# Patient Record
Sex: Female | Born: 1990 | Race: White | Hispanic: No | Marital: Single | Smoking: Former smoker
Health system: Southern US, Community
[De-identification: ages and names within clinical notes are randomized; demographics above are authoritative.]

## PROBLEM LIST (undated history)

## (undated) ENCOUNTER — Inpatient Hospital Stay: Payer: Self-pay

## (undated) DIAGNOSIS — D649 Anemia, unspecified: Secondary | ICD-10-CM

## (undated) HISTORY — PX: TONSILLECTOMY: SUR1361

---

## 2014-04-10 NOTE — L&D Delivery Note (Signed)
Delivery Note At 11:21 PM a viable female was delivered via Vaginal, Spontaneous Delivery (Presentation: ; Occiput Anterior).  APGAR: 8, 9; weight 6 lb 1 oz (2750 g).   Placenta status: , Spontaneous.  Cord:  with the following complications: none apparent.  Cord pH: n/a  Anesthesia: None  Episiotomy:  none Lacerations:  none Suture Repair: none Est. Blood Loss (mL):  250cc  Mom to postpartum.  Baby to Couplet care / Skin to Skin.  ----- Ranae Plumber, MD Attending Obstetrician and Gynecologist [facility]

## 2014-09-18 LAB — OB RESULTS CONSOLE HIV ANTIBODY (ROUTINE TESTING): HIV: NONREACTIVE

## 2014-09-18 LAB — OB RESULTS CONSOLE RPR: RPR: NONREACTIVE

## 2014-09-18 LAB — OB RESULTS CONSOLE VARICELLA ZOSTER ANTIBODY, IGG: VARICELLA IGG: IMMUNE

## 2014-09-18 LAB — OB RESULTS CONSOLE RUBELLA ANTIBODY, IGM: RUBELLA: NON-IMMUNE/NOT IMMUNE

## 2014-11-02 ENCOUNTER — Observation Stay
Admission: EM | Admit: 2014-11-02 | Discharge: 2014-11-02 | Disposition: A | Discharge status: Home / Self Care | Payer: Medicaid - Out of State | Attending: Obstetrics and Gynecology | Admitting: Obstetrics and Gynecology

## 2014-11-02 DIAGNOSIS — R102 Pelvic and perineal pain: Secondary | ICD-10-CM | POA: Insufficient documentation

## 2014-11-02 DIAGNOSIS — Z3A33 33 weeks gestation of pregnancy: Secondary | ICD-10-CM | POA: Diagnosis not present

## 2014-11-02 DIAGNOSIS — O26899 Other specified pregnancy related conditions, unspecified trimester: Secondary | ICD-10-CM

## 2014-11-02 DIAGNOSIS — O26893 Other specified pregnancy related conditions, third trimester: Principal | ICD-10-CM | POA: Insufficient documentation

## 2014-11-02 DIAGNOSIS — R109 Unspecified abdominal pain: Secondary | ICD-10-CM

## 2014-11-02 HISTORY — DX: Anemia, unspecified: D64.9

## 2014-11-02 LAB — CHLAMYDIA/NGC RT PCR (ARMC ONLY)
Chlamydia Tr: NOT DETECTED
N gonorrhoeae: NOT DETECTED

## 2014-11-02 LAB — URINALYSIS COMPLETE WITH MICROSCOPIC (ARMC ONLY)
BILIRUBIN URINE: NEGATIVE
GLUCOSE, UA: NEGATIVE mg/dL
Hgb urine dipstick: NEGATIVE
Ketones, ur: NEGATIVE mg/dL
Nitrite: NEGATIVE
PROTEIN: NEGATIVE mg/dL
Specific Gravity, Urine: 1.019 (ref 1.005–1.030)
pH: 5 (ref 5.0–8.0)

## 2014-11-02 NOTE — Discharge Summary (Signed)
OB Triage Discharge Summary   24 y.o. [id number] @ [redacted]w[redacted]d presenting for vaginal pain.  Fetal status reassuring. EFM showed negative toco and rNST. SVE unchanged at 3cm/50/-2 over 90 minutes.  Labs pending: UCx, GC/CT and UDS RTC  7/29 Disposition: home  Triage evaluation done remotely: No.  Cornelia Copa MD [facility]  Pager: [phone]

## 2014-11-02 NOTE — Discharge Instructions (Signed)
Call provider or return to birthplace with: ° °1. Regular contractions °2. Leaking of fluid from your vaginal °3. Vaginal bleeding: Bright red or heavy like a period °4. Decreased Fetal movement ° °

## 2014-11-02 NOTE — OB Triage Note (Signed)
Pt arrives with complaint of vaginal pressure since 12:30 AM, this is constant with a pain scale of 7/10. She also reports DFM since last night. Denies bleeding, LOF.

## 2014-11-02 NOTE — H&P (Addendum)
Obstetrics Admission History & Physical  11/02/2014 - 6:49 PM Primary OBGYN: [facility]  Chief Complaint: vaginal pain  History of Present Illness  24 y.o. G3P1011 @ [redacted]w[redacted]d (Dating: EDC 9/8, dated by 27wk u/s), with the above CC. Pregnancy complicated by: late PNC, h/o chlamydia, h/o UDS + for xanax and normal 3hr GTT.  Ms. Marilyn Stanley states that she's having some vaginal pain so was advised to come in for evaluation. Some decreased FM but no s/s of labor and no abdominal pain. Minimal white/yellowish non smelly discharge and no itching  Review of Systems:  her 12 point review of systems is negative or as noted in the History of Present Illness.  PMHx:  Past Medical History  Diagnosis Date  . Anemia    PSHx: History reviewed. No pertinent past surgical history. Medications:  Prescriptions prior to admission  Medication Sig Dispense Refill Last Dose  . Prenatal Vit-Fe Fumarate-FA (PRENATAL MULTIVITAMIN) TABS tablet Take 1 tablet by mouth daily at 12 noon.   11/02/2014 at Unknown time     Allergies: has No Known Allergies. OBGYN Hx:  OB History  Gravida Para Term Preterm AB SAB TAB Ectopic Multiple Living  3 1 1  1     1     # Outcome Date GA Lbr Len/2nd Weight Sex Delivery Anes PTL Lv  3 Current           2 AB 05/24/13     TAB     1 Term 08/01/12    F Vag-Spont EPI  Y            FHx:  Family History  Problem Relation Age of Onset  . Cancer Maternal Grandfather   . Cancer Paternal Grandfather    Soc Hx:  History   Social History  . Marital Status: N/A    Spouse Name: N/A  . Number of Children: N/A  . Years of Education: N/A   Occupational History  . Not on file.   Social History Main Topics  . Smoking status: Former Smoker    Types: Cigarettes  . Smokeless tobacco: Never Used  . Alcohol Use: No  . Drug Use: No  . Sexual Activity: Not Currently    Birth Control/ Protection: Abstinence     Comment: last intercourse 12/15   Other Topics Concern  . Not on  file   Social History Narrative  . No narrative on file    Objective    Current Vital Signs 24h Vital Sign Ranges  T 98.6 F (37 C) Temp  Avg: 98.6 F (37 C)  Min: 98.6 F (37 C)  Max: 98.6 F (37 C)  BP 126/78 mmHg BP  Min: 126/78  Max: 126/78  HR 90 Pulse  Avg: 90  Min: 90  Max: 90  RR 18 Resp  Avg: 18  Min: 18  Max: 18  SaO2    98%/RA No Data Recorded       24 Hour I/O Current Shift I/O  Time Ins Outs       EFM: 130 baseline, +accels, no decels, mod variability  Toco: negative  General: Well nourished, well developed female in no acute distress.  Skin:  Warm and dry.  Respiratory:  Normal respiratory effort Abdomen: soft, gravid, nttp Neuro/Psych:  Normal mood and affect.   SSE: 3/70/-2 @ 1715, per RN.  Unchanged at 1900 by me @ 2-3/50/-2/sutures felt, BOW intact  Labs  UDS, GC/CT and UCx pending Results for IRELYND, ZUMSTEIN (MRN [id number]) as  of 11/02/2014 18:57  Ref. Range 11/02/2014 17:36  Appearance Latest Ref Range: CLEAR  CLEAR (A)  Bacteria, UA Latest Ref Range: NONE SEEN  RARE (A)  Bilirubin Urine Latest Ref Range: NEGATIVE  NEGATIVE  Color, Urine Latest Ref Range: YELLOW  YELLOW (A)  Glucose Latest Ref Range: NEGATIVE mg/dL NEGATIVE  Hgb urine dipstick Latest Ref Range: NEGATIVE  NEGATIVE  Ketones, ur Latest Ref Range: NEGATIVE mg/dL NEGATIVE  Leukocytes, UA Latest Ref Range: NEGATIVE  2+ (A)  Mucous Unknown PRESENT  Nitrite Latest Ref Range: NEGATIVE  NEGATIVE  pH Latest Ref Range: 5.0-8.0  5.0  Protein Latest Ref Range: NEGATIVE mg/dL NEGATIVE  RBC / HPF Latest Ref Range: 0-5 RBC/hpf 0-5  Specific Gravity, Urine Latest Ref Range: 1.005-1.030  1.019  Squamous Epithelial / LPF Latest Ref Range: NONE SEEN  0-5 (A)  WBC, UA Latest Ref Range: 0-5 WBC/hpf 6-30    Assessment & Plan   24 y.o. [id number] @ 33/4 with stable cervical exam *IUP: rNST, fetal status reassuring *vaginal pain: likely RL pain *EOL: negative toco (pt is skinny) and pt denies any s/s  of preterm labor, with stable cervical exam over 1.5hrs. PTL precautions given. Given what EFM looks like, likely that patient is probably in the 35-36wk range, given her poor dates *RTC: 7/29 as already scheduled -follow up UCx, GC/CT, UDS  Cornelia Copa MD [facility] Pager [phone]

## 2014-11-04 LAB — URINE CULTURE: Special Requests: NORMAL

## 2014-11-06 LAB — OB RESULTS CONSOLE GC/CHLAMYDIA
Chlamydia: NEGATIVE
Gonorrhea: NEGATIVE

## 2014-11-06 LAB — OB RESULTS CONSOLE GBS: STREP GROUP B AG: NEGATIVE

## 2014-11-12 ENCOUNTER — Observation Stay
Admission: EM | Admit: 2014-11-12 | Discharge: 2014-11-12 | Disposition: A | Discharge status: Home / Self Care | Payer: Medicaid - Out of State | Attending: Obstetrics and Gynecology | Admitting: Obstetrics and Gynecology

## 2014-11-12 DIAGNOSIS — M549 Dorsalgia, unspecified: Secondary | ICD-10-CM | POA: Insufficient documentation

## 2014-11-12 DIAGNOSIS — Z3A35 35 weeks gestation of pregnancy: Secondary | ICD-10-CM | POA: Diagnosis not present

## 2014-11-12 DIAGNOSIS — O26893 Other specified pregnancy related conditions, third trimester: Secondary | ICD-10-CM | POA: Diagnosis not present

## 2014-11-12 LAB — URINALYSIS COMPLETE WITH MICROSCOPIC (ARMC ONLY)
Bilirubin Urine: NEGATIVE
GLUCOSE, UA: NEGATIVE mg/dL
Hgb urine dipstick: NEGATIVE
NITRITE: NEGATIVE
Protein, ur: NEGATIVE mg/dL
Specific Gravity, Urine: 1.015 (ref 1.005–1.030)
pH: 5 (ref 5.0–8.0)

## 2014-11-12 NOTE — Discharge Instructions (Signed)
Drink plenty of fluids, get plenty of rest. cll your provider for any other concerns

## 2014-11-12 NOTE — Discharge Summary (Addendum)
Patient discharged home, discharge instructions given, patient states understanding. Patient left floor in stable condition, denies any other needs at this time. Patient to keep next scheduled OB appointment 

## 2014-11-12 NOTE — ED Notes (Signed)
Patient to L&D for "abdominal tightness". Patient sees Lauralee Evener, saw them last a week ago Friday. Patient reports having one live birth in past. States she is approx 34-36 weeks currently. No complications during this pregnancy. Denies fluid leaking or bleeding.

## 2014-11-12 NOTE — OB Triage Note (Signed)
Received to LD with complaints of lower back pain, hip pain and pelvic pressure.  Pt also states she is unable to eat or drink without throwing up. Last attempted to eat/drink was 12:00 and threw up

## 2014-11-12 NOTE — H&P (Signed)
Obstetric H&P   Chief Complaint: Mutiple  Prenatal Care Provider: [facility]  History of Present Illness: 24 y.o. G3P1011 [redacted]w[redacted]d by 12/17/2014 with late entry to care, positive UDS for xanax, positive chlamydia, failed 1-hr and normal follow up 3-hr test presenting from work with complaints of back pain, pelvic pressure, as well as nausea and emesis unable to keep down any food started this morning.  Has been able to keep down a full glass of water on presentation here without any episodes of emesis.    PNL: O pos / ABSC / RNI / VZI / HBsAg neg / RPR NR / HIV neg  TDAP adminsited 7/18  Review of Systems: 10 point review of systems negative unless otherwise noted in HPI  Past Medical History: Past Medical History  Diagnosis Date  . Anemia    Past Gynecologic History:  Family History: Family History  Problem Relation Age of Onset  . Cancer Maternal Grandfather   . Cancer Paternal Grandfather     Social History: History   Social History  . Marital Status: Single    Spouse Name: N/A  . Number of Children: N/A  . Years of Education: N/A   Occupational History  . Not on file.   Social History Main Topics  . Smoking status: Former Smoker    Types: Cigarettes  . Smokeless tobacco: Never Used  . Alcohol Use: No  . Drug Use: No  . Sexual Activity: Not Currently    Birth Control/ Protection: Abstinence     Comment: last intercourse 12/15   Other Topics Concern  . Not on file   Social History Narrative  . No narrative on file    Medications: Prior to Admission medications   Medication Sig Start Date End Date Taking? Authorizing Provider  Prenatal Vit-Fe Fumarate-FA (PRENATAL MULTIVITAMIN) TABS tablet Take 1 tablet by mouth daily at 12 noon.    Historical Provider, MD    Allergies: No Known Allergies  Physical Exam: Vitals: Blood pressure 122/70, pulse 91, last menstrual period 03/12/2014.  FHT: 130, moderate, postive accels, no decels Toco: none  General:  NAD HEENT: normocephalic, anciteric Pulmonary: no increased work of breathing Abdomen: Gravid,  Non-tender Leopolds: vtx Genitourinary: 2/70/-3 Extremities: no edema, no erythema  Labs: Results for orders placed or performed during the hospital encounter of 11/12/14 (from the past 24 hour(s))  Urinalysis complete, with microscopic ([facility] only)     Status: Abnormal   Collection Time: 11/12/14  3:03 PM  Result Value Ref Range   Color, Urine YELLOW (A) YELLOW   APPearance HAZY (A) CLEAR   Glucose, UA NEGATIVE NEGATIVE mg/dL   Bilirubin Urine NEGATIVE NEGATIVE   Ketones, ur TRACE (A) NEGATIVE mg/dL   Specific Gravity, Urine 1.015 1.005 - 1.030   Hgb urine dipstick NEGATIVE NEGATIVE   pH 5.0 5.0 - 8.0   Protein, ur NEGATIVE NEGATIVE mg/dL   Nitrite NEGATIVE NEGATIVE   Leukocytes, UA 1+ (A) NEGATIVE   RBC / HPF 0-5 0 - 5 RBC/hpf   WBC, UA 6-30 0 - 5 WBC/hpf   Bacteria, UA RARE (A) NONE SEEN   Squamous Epithelial / LPF 6-30 (A) NONE SEEN   Mucous PRESENT     Assessment: 24 y.o. G3P1011 [redacted]w[redacted]d by 12/17/2014, with discomforts of pregnancy  Plan: 1) Discomforts of pregnancy - no evidence of pyelonephritis, UTI, or labor.  Discussed supportive measures including heating pads, tylenol prn, pregnancy support belts  2) Fetus - cat I tracing  3) Nausea - rx zofran  ODT  4) Disposition - home routine labor precautions

## 2014-12-02 ENCOUNTER — Inpatient Hospital Stay
Admission: EM | Admit: 2014-12-02 | Discharge: 2014-12-04 | DRG: 774 | Disposition: A | Discharge status: Home / Self Care | Payer: Medicaid - Out of State | Attending: Obstetrics & Gynecology | Admitting: Obstetrics & Gynecology

## 2014-12-02 ENCOUNTER — Encounter: Payer: Self-pay | Admitting: Anesthesiology

## 2014-12-02 DIAGNOSIS — O9882 Other maternal infectious and parasitic diseases complicating childbirth: Principal | ICD-10-CM | POA: Diagnosis present

## 2014-12-02 DIAGNOSIS — Z3A37 37 weeks gestation of pregnancy: Secondary | ICD-10-CM | POA: Diagnosis present

## 2014-12-02 DIAGNOSIS — Z809 Family history of malignant neoplasm, unspecified: Secondary | ICD-10-CM | POA: Diagnosis not present

## 2014-12-02 DIAGNOSIS — Z87891 Personal history of nicotine dependence: Secondary | ICD-10-CM

## 2014-12-02 DIAGNOSIS — O9902 Anemia complicating childbirth: Secondary | ICD-10-CM | POA: Diagnosis present

## 2014-12-02 LAB — CBC
HCT: 25.9 % — ABNORMAL LOW (ref 35.0–47.0)
Hemoglobin: 8 g/dL — ABNORMAL LOW (ref 12.0–16.0)
MCH: 24.7 pg — ABNORMAL LOW (ref 26.0–34.0)
MCHC: 31 g/dL — AB (ref 32.0–36.0)
MCV: 79.7 fL — ABNORMAL LOW (ref 80.0–100.0)
PLATELETS: 212 10*3/uL (ref 150–440)
RBC: 3.25 MIL/uL — ABNORMAL LOW (ref 3.80–5.20)
RDW: 16.2 % — AB (ref 11.5–14.5)
WBC: 16.9 10*3/uL — AB (ref 3.6–11.0)

## 2014-12-02 LAB — TYPE AND SCREEN
ABO/RH(D): O POS
ANTIBODY SCREEN: NEGATIVE

## 2014-12-02 LAB — COMPREHENSIVE METABOLIC PANEL
ALT: 9 U/L — ABNORMAL LOW (ref 14–54)
AST: 22 U/L (ref 15–41)
Albumin: 2.6 g/dL — ABNORMAL LOW (ref 3.5–5.0)
Alkaline Phosphatase: 135 U/L — ABNORMAL HIGH (ref 38–126)
Anion gap: 7 (ref 5–15)
BILIRUBIN TOTAL: 0.5 mg/dL (ref 0.3–1.2)
BUN: 6 mg/dL (ref 6–20)
CO2: 23 mmol/L (ref 22–32)
Calcium: 8.4 mg/dL — ABNORMAL LOW (ref 8.9–10.3)
Chloride: 106 mmol/L (ref 101–111)
Creatinine, Ser: 0.52 mg/dL (ref 0.44–1.00)
Glucose, Bld: 81 mg/dL (ref 65–99)
POTASSIUM: 3.7 mmol/L (ref 3.5–5.1)
Sodium: 136 mmol/L (ref 135–145)
TOTAL PROTEIN: 5.9 g/dL — AB (ref 6.5–8.1)

## 2014-12-02 LAB — URINE DRUG SCREEN, QUALITATIVE (ARMC ONLY)
Amphetamines, Ur Screen: NOT DETECTED
BARBITURATES, UR SCREEN: NOT DETECTED
Benzodiazepine, Ur Scrn: NOT DETECTED
CANNABINOID 50 NG, UR ~~LOC~~: NOT DETECTED
COCAINE METABOLITE, UR ~~LOC~~: NOT DETECTED
MDMA (Ecstasy)Ur Screen: NOT DETECTED
Methadone Scn, Ur: NOT DETECTED
Opiate, Ur Screen: POSITIVE — AB
Phencyclidine (PCP) Ur S: NOT DETECTED
TRICYCLIC, UR SCREEN: NOT DETECTED

## 2014-12-02 LAB — CHLAMYDIA/NGC RT PCR (ARMC ONLY)
CHLAMYDIA TR: NOT DETECTED
N gonorrhoeae: NOT DETECTED

## 2014-12-02 MED ORDER — ACETAMINOPHEN 325 MG PO TABS
650.0000 mg | ORAL_TABLET | ORAL | Status: DC | PRN
  Filled 2014-12-02: qty 2

## 2014-12-02 MED ORDER — LIDOCAINE HCL (PF) 1 % IJ SOLN
30.0000 mL | INTRAMUSCULAR | Status: DC | PRN
  Filled 2014-12-02: qty 30

## 2014-12-02 MED ORDER — BUTORPHANOL TARTRATE 1 MG/ML IJ SOLN
1.0000 mg | INTRAMUSCULAR | Status: DC | PRN
  Administered 2014-12-02: 1 mg via INTRAVENOUS
  Filled 2014-12-02: qty 1

## 2014-12-02 MED ORDER — FENTANYL 2.5 MCG/ML W/ROPIVACAINE 0.2% IN NS 100 ML EPIDURAL INFUSION (ARMC-ANES)
EPIDURAL | Status: AC
  Filled 2014-12-02: qty 100

## 2014-12-02 MED ORDER — LACTATED RINGERS IV SOLN
INTRAVENOUS | Status: DC
  Administered 2014-12-02: 22:00:00 via INTRAVENOUS

## 2014-12-02 MED ORDER — CITRIC ACID-SODIUM CITRATE 334-500 MG/5ML PO SOLN: 30.0000 mL | ORAL | Status: DC | PRN

## 2014-12-02 MED ORDER — ONDANSETRON HCL 4 MG/2ML IJ SOLN: 4.0000 mg | Freq: Four times a day (QID) | INTRAMUSCULAR | Status: DC | PRN

## 2014-12-02 MED ORDER — OXYTOCIN 40 UNITS IN LACTATED RINGERS INFUSION - SIMPLE MED
62.5000 mL/h | INTRAVENOUS | Status: DC
  Filled 2014-12-02: qty 1000

## 2014-12-02 MED ORDER — OXYTOCIN BOLUS FROM INFUSION
500.0000 mL | INTRAVENOUS | Status: DC
Start: 2014-12-02 — End: 2014-12-03
  Administered 2014-12-02: 500 mL via INTRAVENOUS

## 2014-12-02 NOTE — H&P (Signed)
Obstetric H&P   Chief Complaint: Mutiple  Prenatal Care Provider: [facility]  History of Present Illness: 24 y.o. G3P1011 37.6 with EDC of 12/17/2014. With late entry to care, positive UDS for xanax, positive chlamydia, failed 1-hr and normal follow up 3-hr test presents to L&D triage with concern for ROM.  She states she had a orange/yellow discharge yesterday, and has some persistent discharge since.  Nothing that fills a pad or soils her clothes.  +FM, no CTX, no VB.  PNL: O pos / ABSC / RNI / VZI / HBsAg neg / RPR NR / HIV neg  TDAP adminsited 7/18  Review of Systems: 10 point review of systems negative unless otherwise noted in HPI  Past Medical History: Past Medical History  Diagnosis Date  . Anemia    Past Gynecologic History:  Family History: Family History  Problem Relation Age of Onset  . Cancer Maternal Grandfather   . Cancer Paternal Grandfather     Social History: Social History   Social History  . Marital Status: Single    Spouse Name: N/A  . Number of Children: N/A  . Years of Education: N/A   Occupational History  . Not on file.   Social History Main Topics  . Smoking status: Former Smoker    Types: Cigarettes  . Smokeless tobacco: Never Used  . Alcohol Use: No  . Drug Use: No  . Sexual Activity: Not Currently    Birth Control/ Protection: Abstinence     Comment: last intercourse 12/15   Other Topics Concern  . Not on file   Social History Narrative  . No narrative on file    Medications: Prior to Admission medications   Medication Sig Start Date End Date Taking? Authorizing Provider  Prenatal Vit-Fe Fumarate-FA (PRENATAL MULTIVITAMIN) TABS tablet Take 1 tablet by mouth daily at 12 noon.    Historical Provider, MD    Allergies: No Known Allergies  Physical Exam: Vitals BP 136/83 mmHg  Pulse 100  Temp(Src) 98.1 F (36.7 C) (Oral)  Resp 19     FHT: 140, moderate, postive accels, no decels Toco: q33mn SVE: 5/80/-1, membranes  intact SSE: nitrazine negative  General: NAD HEENT: normocephalic, anciteric Pulmonary: no increased work of breathing Abdomen: Gravid,  Non-tender Leopolds: vtx Extremities: no edema, no erythema  Labs: Results for orders placed or performed during the hospital encounter of 12/02/14 (from the past 24 hour(s))  CBC     Status: Abnormal   Collection Time: 12/02/14  4:30 PM  Result Value Ref Range   WBC 16.9 (H) 3.6 - 11.0 K/uL   RBC 3.25 (L) 3.80 - 5.20 MIL/uL   Hemoglobin 8.0 (L) 12.0 - 16.0 g/dL   HCT 25.9 (L) 35.0 - 47.0 %   MCV 79.7 (L) 80.0 - 100.0 fL   MCH 24.7 (L) 26.0 - 34.0 pg   MCHC 31.0 (L) 32.0 - 36.0 g/dL   RDW 16.2 (H) 11.5 - 14.5 %   Platelets 212 150 - 440 K/uL  Type and screen     Status: None (Preliminary result)   Collection Time: 12/02/14  4:30 PM  Result Value Ref Range   ABO/RH(D) PENDING    Antibody Screen PENDING    Sample Expiration 12/05/2014     Assessment: 24y.o. G3P1011 320w0dy 12/17/2014, with discomforts of pregnancy  Plan: 1) Recheck in a few hours for evidence of labor.     ++++++++++++++++++  Rechecked:  Cervix now 6cm, 80%, still -1  1. Admit for  labor 2. Continuous toco/eft 3. Patient has eaten/showered/walked around 4. Will AROM when settled. 5. IVF LR @ 125, CBC, T&S, GC/CT, UDS 7. Clear liquid diet for now. 8. Anticipate vaginal delivery 9. MMR post partum  ----- Larey Days, MD Attending Obstetrician and Gynecologist [facility]

## 2014-12-03 LAB — RPR: RPR Ser Ql: NONREACTIVE

## 2014-12-03 LAB — CBC
HCT: 23.5 % — ABNORMAL LOW (ref 35.0–47.0)
HEMOGLOBIN: 7.4 g/dL — AB (ref 12.0–16.0)
MCH: 24.5 pg — AB (ref 26.0–34.0)
MCHC: 31.3 g/dL — AB (ref 32.0–36.0)
MCV: 78.3 fL — AB (ref 80.0–100.0)
Platelets: 205 10*3/uL (ref 150–440)
RBC: 3 MIL/uL — ABNORMAL LOW (ref 3.80–5.20)
RDW: 16.2 % — ABNORMAL HIGH (ref 11.5–14.5)
WBC: 22.1 10*3/uL — ABNORMAL HIGH (ref 3.6–11.0)

## 2014-12-03 LAB — ABO/RH: ABO/RH(D): O POS

## 2014-12-03 MED ORDER — BENZOCAINE-MENTHOL 20-0.5 % EX AERO
1.0000 "application " | INHALATION_SPRAY | CUTANEOUS | Status: DC | PRN
  Administered 2014-12-03: 1 via TOPICAL
  Filled 2014-12-03: qty 56

## 2014-12-03 MED ORDER — DIBUCAINE 1 % RE OINT: 1.0000 "application " | TOPICAL_OINTMENT | RECTAL | Status: DC | PRN

## 2014-12-03 MED ORDER — TETANUS-DIPHTH-ACELL PERTUSSIS 5-2.5-18.5 LF-MCG/0.5 IM SUSP: 0.5000 mL | Freq: Once | INTRAMUSCULAR | Status: DC

## 2014-12-03 MED ORDER — IBUPROFEN 600 MG PO TABS
600.0000 mg | ORAL_TABLET | Freq: Four times a day (QID) | ORAL | Status: DC
  Administered 2014-12-03 – 2014-12-04 (×6): 600 mg via ORAL
  Filled 2014-12-03 (×5): qty 1

## 2014-12-03 MED ORDER — MEASLES, MUMPS & RUBELLA VAC ~~LOC~~ INJ
0.5000 mL | INJECTION | Freq: Once | SUBCUTANEOUS | Status: AC
  Administered 2014-12-04: 0.5 mL via SUBCUTANEOUS
  Filled 2014-12-03 (×2): qty 0.5

## 2014-12-03 MED ORDER — DOCUSATE SODIUM 100 MG PO CAPS
100.0000 mg | ORAL_CAPSULE | Freq: Two times a day (BID) | ORAL | Status: DC
  Administered 2014-12-03 – 2014-12-04 (×2): 100 mg via ORAL
  Filled 2014-12-03 (×2): qty 1

## 2014-12-03 MED ORDER — ONDANSETRON HCL 4 MG PO TABS: 4.0000 mg | ORAL_TABLET | ORAL | Status: DC | PRN

## 2014-12-03 MED ORDER — DIPHENHYDRAMINE HCL 25 MG PO CAPS: 25.0000 mg | ORAL_CAPSULE | Freq: Four times a day (QID) | ORAL | Status: DC | PRN

## 2014-12-03 MED ORDER — ONDANSETRON HCL 4 MG/2ML IJ SOLN: 4.0000 mg | INTRAMUSCULAR | Status: DC | PRN

## 2014-12-03 MED ORDER — IBUPROFEN 800 MG PO TABS
ORAL_TABLET | ORAL | Status: AC
  Administered 2014-12-03: 600 mg via ORAL
  Filled 2014-12-03: qty 1

## 2014-12-03 MED ORDER — SIMETHICONE 80 MG PO CHEW: 80.0000 mg | CHEWABLE_TABLET | ORAL | Status: DC | PRN

## 2014-12-03 MED ORDER — WITCH HAZEL-GLYCERIN EX PADS: 1.0000 "application " | MEDICATED_PAD | CUTANEOUS | Status: DC | PRN

## 2014-12-03 MED ORDER — FE FUMARATE-B12-VIT C-FA-IFC PO CAPS
1.0000 | ORAL_CAPSULE | Freq: Two times a day (BID) | ORAL | Status: DC
  Administered 2014-12-04: 1 via ORAL
  Filled 2014-12-03 (×6): qty 1

## 2014-12-03 MED ORDER — LANOLIN HYDROUS EX OINT: TOPICAL_OINTMENT | CUTANEOUS | Status: DC | PRN

## 2014-12-03 MED ORDER — ACETAMINOPHEN 325 MG PO TABS
650.0000 mg | ORAL_TABLET | ORAL | Status: DC | PRN
  Administered 2014-12-03: 650 mg via ORAL

## 2014-12-03 MED ORDER — PRENATAL MULTIVITAMIN CH
1.0000 | ORAL_TABLET | Freq: Every day | ORAL | Status: DC
  Administered 2014-12-03 – 2014-12-04 (×2): 1 via ORAL
  Filled 2014-12-03 (×2): qty 1

## 2014-12-03 NOTE — Discharge Summary (Signed)
Obstetrical Discharge Summary  Date of Admission: 12/02/2014 Date of Discharge: 12/04/2014  Primary OB: [facility]  Gestational Age at Delivery: [redacted]w[redacted]d   Antepartum complications: none apparent Reason for Admission:  labor Date of Delivery:  12/02/2014 Delivered By: Leeroy Bock Ward Delivery Type: spontaneous vaginal delivery Intrapartum complications/course: routine Anesthesia: epidural Placenta: sponatneous Laceration: none Episiotomy: none Newborn Data: Live born female  Birth Weight: 6 lb 1 oz (2750 g) APGAR: 8, 9  Discharge Physical Exam:  General: NAD CV: RRR Pulm: CTABL, nl effort ABD: s/nd/nt, fundus firm and below the umbilicus Lochia: moderate DVT Evaluation: LE non-ttp, no evidence of DVT on exam.  HEMOGLOBIN  Date Value Ref Range Status  12/03/2014 7.4* 12.0 - 16.0 g/dL Final   HCT  Date Value Ref Range Status  12/03/2014 23.5* 35.0 - 47.0 % Final    Post partum course: routine Postpartum Procedures: none Disposition: stable, discharge to home.  Rh Immune globulin given: no Rubella vaccine given: yes Tdap vaccine given in AP or PP setting: postpartum Flu vaccine given in AP or PP setting: n/a  Contraception: plans laparoscopic tubal (declines while in hospital)  Prenatal Labs:  O pos / ABSC / RNI / VZI / HBsAg neg / RPR NR  / HIV neg/ gbs neg   Plan:  Marilyn Stanley was discharged to home in good condition. Follow-up appointment at [facility] with Dr. Elesa Massed in 4 weeks.   Discharge Medications:   Medication List    TAKE these medications        ibuprofen 600 MG tablet  Commonly known as:  ADVIL,MOTRIN  Take 1 tablet (600 mg total) by mouth every 6 (six) hours.     prenatal multivitamin Tabs tablet  Take 1 tablet by mouth daily at 12 noon.        Signed: Annamarie Major, MD [phone]

## 2014-12-03 NOTE — Progress Notes (Signed)
apprrPost Partum Day 1 (12 hours pp) Subjective: Feels weak when OOB. Voiding without difficulty. Baby bottlefeeding well. Denies lightheadedness or palpitations  Objective: Blood pressure 126/71, pulse 78, temperature 98 F (36.7 C), temperature source Oral, resp. rate 20, height 5' 4"  (1.626 m), weight 145 lb (65.772 kg), last menstrual period 03/12/2014, SpO2 100 %, unknown if currently breastfeeding.  Physical Exam:  General: alert, cooperative and pale Lochia: appropriate Uterine Fundus: firm at U-2/ ML/ NT DVT Evaluation: No evidence of DVT seen on physical exam.   Recent Labs  12/02/14 1630 12/03/14 0546  HGB 8.0* 7.4*  HCT 25.9* 23.5*  WBC 16.9* 22.1*  PLT 212 205    Assessment/Plan: PPD #1 Chronic anemia with appropriate drop after delivery  Hemodynamically stable-po iron and vitamin supplementation  Safety issues discussed Contraception: wants BTL. May need to do prior to 6 weeks visit. Discussed high rate of regret at her age  61 to schedule appt with Dr Leonides Schanz to discuss [id number] tubal once recovered from delivery and anemia improved vs using a LARC. Bottle Needs MMR prior to discharge Probable discharge in AM.    LOS: 1 day   Marilyn Stanley 12/03/2014, 12:46 PM

## 2014-12-03 NOTE — Plan of Care (Signed)
Patient and newborn UDS were both positive for opiates.  Patient admitted to taking hydrocodone during the last month of her pregnancy.  She said she was taking the medication due to her being dilated approx. 4 cm for the last month leading up to delivery, but she still needed to continue working.  The pain medication was taken to tolerate work shifts.  Per patient, her OB was aware of her taking the medication and that she already had it (so it was not a new prescription).  Reviewed the NAS scoring process with the patient and answered her questions.  Also explained to patient that she would be visited by the pediatrician and social worker per [facility] policy.  Patient was not defensive; she offered information willingly; she expressed the desire to make sure the baby was ok.

## 2014-12-04 MED ORDER — IBUPROFEN 600 MG PO TABS: 600.0000 mg | ORAL_TABLET | Freq: Four times a day (QID) | ORAL | Status: DC

## 2014-12-04 NOTE — Discharge Instructions (Signed)
Vaginal Delivery, Care After °Refer to this sheet in the next few weeks. These discharge instructions provide you with information on caring for yourself after delivery. Your caregiver may also give you specific instructions. Your treatment has been planned according to the most current medical practices available, but problems sometimes occur. Call your caregiver if you have any problems or questions after you go home. °HOME CARE INSTRUCTIONS °· Take over-the-counter or prescription medicines only as directed by your caregiver or pharmacist. °· Do not drink alcohol, especially if you are breastfeeding or taking medicine to relieve pain. °· Do not chew or smoke tobacco. °· Do not use illegal drugs. °· Continue to use good perineal care. Good perineal care includes: °¨ Wiping your perineum from front to back. °¨ Keeping your perineum clean. °· Do not use tampons or douche until your caregiver says it is okay. °· Shower, wash your hair, and take tub baths as directed by your caregiver. °· Wear a well-fitting bra that provides breast support. °· Eat healthy foods. °· Drink enough fluids to keep your urine clear or pale yellow. °· Eat high-fiber foods such as whole grain cereals and breads, brown rice, beans, and fresh fruits and vegetables every day. These foods may help prevent or relieve constipation. °· Follow your caregiver's recommendations regarding resumption of activities such as climbing stairs, driving, lifting, exercising, or traveling. °· Talk to your caregiver about resuming sexual activities. Resumption of sexual activities is dependent upon your risk of infection, your rate of healing, and your comfort and desire to resume sexual activity. °· Try to have someone help you with your household activities and your newborn for at least a few days after you leave the hospital. °· Rest as much as possible. Try to rest or take a nap when your newborn is sleeping. °· Increase your activities gradually. °· Keep  all of your scheduled postpartum appointments. It is very important to keep your scheduled follow-up appointments. At these appointments, your caregiver will be checking to make sure that you are healing physically and emotionally. °SEEK MEDICAL CARE IF:  °· You are passing large clots from your vagina. Save any clots to show your caregiver. °· You have a foul smelling discharge from your vagina. °· You have trouble urinating. °· You are urinating frequently. °· You have pain when you urinate. °· You have a change in your bowel movements. °· You have increasing redness, pain, or swelling near your vaginal incision (episiotomy) or vaginal tear. °· You have pus draining from your episiotomy or vaginal tear. °· Your episiotomy or vaginal tear is separating. °· You have painful, hard, or reddened breasts. °· You have a severe headache. °· You have blurred vision or see spots. °· You feel sad or depressed. °· You have thoughts of hurting yourself or your newborn. °· You have questions about your care, the care of your newborn, or medicines. °· You are dizzy or light-headed. °· You have a rash. °· You have nausea or vomiting. °· You were breastfeeding and have not had a menstrual period within 12 weeks after you stopped breastfeeding. °· You are not breastfeeding and have not had a menstrual period by the 12th week after delivery. °· You have a fever. °SEEK IMMEDIATE MEDICAL CARE IF:  °· You have persistent pain. °· You have chest pain. °· You have shortness of breath. °· You faint. °· You have leg pain. °· You have stomach pain. °· Your vaginal bleeding saturates two or more sanitary pads   in 1 hour. MAKE SURE YOU:   Understand these instructions.  Will watch your condition.  Will get help right away if you are not doing well or get worse. Document Released: 03/24/2000 Document Revised: 08/11/2013 Document Reviewed: 11/22/2011 [facility] Patient Information 2015 [place], [place]. This information is not intended to  replace advice given to you by your health care provider. Make sure you discuss any questions you have with your health care provider.  Please call your doctor or return to the ER if you experience any chest pains, shortness of breath, fever greater than 101, any heavy bleeding or large clots, and foul smelling vaginal discharge, any worsening abdominal pain & cramping that is not controlled by pain medication, or any signs of post partum depression.  No tampons, enemas, douches, or sexual intercourse for 6 weeks.  Also avoid tub baths, hot tubs, or swimming for 6 weeks.

## 2014-12-04 NOTE — Progress Notes (Signed)
Patient given discharge instructions at bedside and verbalized understanding of care of self and newborn when return to home. Patient informed of date and time of follow up appointment with MD and expresses no need for further teaching. Patient discharged to home, but newborn will remain inpatient as pediatric patient for further evaluation. Patient will remain at bedside with baby.

## 2014-12-04 NOTE — Progress Notes (Signed)
Post Partum Day 2  Subjective: Diet and Ambulating well.  Voiding without difficulty. Baby bottlefeeding well. Denies lightheadedness or palpitations  Objective: Blood pressure 118/66, pulse 90, temperature 98 F (36.7 C), temperature source Oral, resp. rate 18, height _0  (1.626 m), weight 65.772 kg (145 lb), last menstrual period 03/12/2014, SpO2 100 %, unknown if currently breastfeeding.  Physical Exam:  General: alert, cooperative and pale Lochia: appropriate Uterine Fundus: firm at U-2/ ML/ NT DVT Evaluation: No evidence of DVT seen on physical exam.   Recent Labs  12/02/14 1630 12/03/14 0546  HGB 8.0* 7.4*  HCT 25.9* 23.5*  WBC 16.9* 22.1*  PLT 212 205    Assessment/Plan: PPD #2 Chronic anemia with appropriate drop after delivery  Hemodynamically stable-po iron and vitamin supplementation  Safety issues discussed Contraception: wants BTL. May need to do prior to 6 weeks visit. Discussed high rate of regret at her age  24 to schedule appt with Dr Leonides Schanz to discuss IWO:[id number] tubal once recovered from delivery and anemia improved vs using a LARC. Bottle Feeding Needs MMR prior to discharge TDaP up to date   LOS: 2 days   [place] 12/04/2014, 8:26 AM

## 2014-12-04 NOTE — Progress Notes (Signed)
Clinical Education officer, museum (CSW) received consult for positive opiates in urine screen for Marilyn Stanley and mom. CSW discussed case with RN before assessing Marilyn Stanley. Per RN Mom has been appropriate throughout hospitalization and consult was put in by MD because it was mandatory. CSW met with Marilyn Stanley Marilyn Stanley) at bedside. CSW introduced self and explained role of CSW department. Mom answered all questions and was appropriate during assessment. Marilyn Stanley reported that she lives in [place] with her Marilyn Stanley Marilyn Stanley and has a 84 year old daughter Marilyn Stanley. Per Marilyn Stanley this is her second child. Marilyn Stanley works at the [employer] in [place] and has to [employer] and be on concrete floors all day. Marilyn Stanley reported that she was 4 CM dilated for a week and took 1/2 of hydrocodone pill to get through work. Marilyn Stanley reported that she got the pill from her friend at work and does not have a prescription. Marilyn Stanley reported that she told her MD she was taking the hydrocodone. Marilyn Stanley denied alcohol and drug use. CSW explained that a Child Protective Services report will have to be made based on Marilyn Stanley having a positive urine screen. Mom verbalized her understanding and reacted appropriately. CSW explained to mom that CPS will likely call and come out to the house to assess safety of the children. Marilyn Stanley and Marilyn Stanley are being discharged from hospital today.   CPS report was made in [facility].   Marilyn Stanley, [name] [phone]

## 2015-04-14 ENCOUNTER — Other Ambulatory Visit: Payer: Self-pay | Admitting: Family Medicine

## 2015-04-14 ENCOUNTER — Ambulatory Visit
Admission: RE | Admit: 2015-04-14 | Discharge: 2015-04-14 | Disposition: A | Discharge status: Home / Self Care | Payer: BLUE CROSS/BLUE SHIELD | Source: Ambulatory Visit | Attending: Family Medicine | Admitting: Family Medicine

## 2015-04-14 DIAGNOSIS — Z975 Presence of (intrauterine) contraceptive device: Secondary | ICD-10-CM | POA: Insufficient documentation

## 2015-04-14 DIAGNOSIS — R0609 Other forms of dyspnea: Principal | ICD-10-CM

## 2015-04-14 DIAGNOSIS — Z862 Personal history of diseases of the blood and blood-forming organs and certain disorders involving the immune mechanism: Secondary | ICD-10-CM | POA: Diagnosis present

## 2015-04-14 MED ORDER — IOHEXOL 350 MG/ML SOLN
75.0000 mL | Freq: Once | INTRAVENOUS | Status: AC | PRN
  Administered 2015-04-14: 75 mg via INTRAVENOUS

## 2015-04-19 ENCOUNTER — Emergency Department
Admission: EM | Admit: 2015-04-19 | Discharge: 2015-04-19 | Disposition: A | Discharge status: Home / Self Care | Payer: BLUE CROSS/BLUE SHIELD | Attending: Emergency Medicine | Admitting: Emergency Medicine

## 2015-04-19 ENCOUNTER — Encounter: Payer: Self-pay | Admitting: Emergency Medicine

## 2015-04-19 DIAGNOSIS — Z87891 Personal history of nicotine dependence: Secondary | ICD-10-CM | POA: Diagnosis not present

## 2015-04-19 DIAGNOSIS — R42 Dizziness and giddiness: Secondary | ICD-10-CM

## 2015-04-19 DIAGNOSIS — Z3202 Encounter for pregnancy test, result negative: Secondary | ICD-10-CM | POA: Diagnosis not present

## 2015-04-19 DIAGNOSIS — Z79899 Other long term (current) drug therapy: Secondary | ICD-10-CM | POA: Insufficient documentation

## 2015-04-19 DIAGNOSIS — R634 Abnormal weight loss: Secondary | ICD-10-CM | POA: Diagnosis not present

## 2015-04-19 DIAGNOSIS — R0602 Shortness of breath: Secondary | ICD-10-CM | POA: Diagnosis not present

## 2015-04-19 LAB — URINALYSIS COMPLETE WITH MICROSCOPIC (ARMC ONLY)
BILIRUBIN URINE: NEGATIVE
Bacteria, UA: NONE SEEN
Glucose, UA: NEGATIVE mg/dL
HGB URINE DIPSTICK: NEGATIVE
Nitrite: NEGATIVE
PH: 6 (ref 5.0–8.0)
Protein, ur: NEGATIVE mg/dL
Specific Gravity, Urine: 1.027 (ref 1.005–1.030)

## 2015-04-19 LAB — COMPREHENSIVE METABOLIC PANEL
ALBUMIN: 4.5 g/dL (ref 3.5–5.0)
ALT: 11 U/L — ABNORMAL LOW (ref 14–54)
ANION GAP: 5 (ref 5–15)
AST: 15 U/L (ref 15–41)
Alkaline Phosphatase: 92 U/L (ref 38–126)
BILIRUBIN TOTAL: 0.5 mg/dL (ref 0.3–1.2)
BUN: 14 mg/dL (ref 6–20)
CO2: 27 mmol/L (ref 22–32)
Calcium: 9.3 mg/dL (ref 8.9–10.3)
Chloride: 108 mmol/L (ref 101–111)
Creatinine, Ser: 0.87 mg/dL (ref 0.44–1.00)
Glucose, Bld: 93 mg/dL (ref 65–99)
POTASSIUM: 4.2 mmol/L (ref 3.5–5.1)
Sodium: 140 mmol/L (ref 135–145)
TOTAL PROTEIN: 7.9 g/dL (ref 6.5–8.1)

## 2015-04-19 LAB — POCT PREGNANCY, URINE: Preg Test, Ur: NEGATIVE

## 2015-04-19 LAB — CBC
HEMATOCRIT: 36.3 % (ref 35.0–47.0)
Hemoglobin: 11.3 g/dL — ABNORMAL LOW (ref 12.0–16.0)
MCH: 23.2 pg — ABNORMAL LOW (ref 26.0–34.0)
MCHC: 31.2 g/dL — AB (ref 32.0–36.0)
MCV: 74.5 fL — AB (ref 80.0–100.0)
Platelets: 318 10*3/uL (ref 150–440)
RBC: 4.88 MIL/uL (ref 3.80–5.20)
RDW: 19.7 % — AB (ref 11.5–14.5)
WBC: 13.1 10*3/uL — ABNORMAL HIGH (ref 3.6–11.0)

## 2015-04-19 LAB — DIFFERENTIAL
BASOS ABS: 0.1 10*3/uL (ref 0–0.1)
BASOS PCT: 1 %
EOS ABS: 0.4 10*3/uL (ref 0–0.7)
Eosinophils Relative: 3 %
LYMPHS ABS: 3.2 10*3/uL (ref 1.0–3.6)
LYMPHS PCT: 25 %
Monocytes Absolute: 0.8 10*3/uL (ref 0.2–0.9)
Monocytes Relative: 6 %
NEUTROS PCT: 65 %
Neutro Abs: 8.6 10*3/uL — ABNORMAL HIGH (ref 1.4–6.5)

## 2015-04-19 LAB — ABO/RH: ABO/RH(D): O POS

## 2015-04-19 MED ORDER — SODIUM CHLORIDE 0.9 % IV BOLUS (SEPSIS)
1000.0000 mL | Freq: Once | INTRAVENOUS | Status: AC
  Administered 2015-04-19: 1000 mL via INTRAVENOUS

## 2015-04-19 NOTE — ED Notes (Signed)
Past couple of weeks, pt has been sob with exertion to the point she has to sit down, denies asthma or cough, states she has had some rectal bleeding with clots in the toilet, denies hemorrhoids. Mom states pt was diagnosed with anemia, dr appoint on Jan 18 for further testing. Pt appears pale.

## 2015-04-19 NOTE — Discharge Instructions (Signed)

## 2015-04-19 NOTE — ED Provider Notes (Signed)
Review of Care Everywhere: "She was diagnosed with dehydration at the emergency room and had instructions to hydrate well. She was seen here the next day after the visit of the emergency room, it was found that she had anemia and because of her previous history of hypothyroidism her thyroid was checked showing that she had some nodules the patient reported unexpected weight loss of about 15 pounds in 4 months. An ultrasound of her neck and thyroid was performed showing nodular thyroid. As time has progressed her shortness of breath has increased." Last hgb was 11.7 04/14/15  [facility] Emergency Department Provider Note  ____________________________________________  Time seen: Approximately 3:56 PM  I have reviewed the triage vital signs and the nursing notes.   HISTORY  Chief Complaint Shortness of Breath; Anemia; and Fatigue    HPI Marilyn Stanley is a 25 y.o. female has been experiencing shortness of breath, weight loss and feeling lightheaded especially while walking at work for about the last month. She reports that she is seen both her [facility], and recently had a CT scan to rule out a blood clot less than a week ago for the same. She reports she felt much better after receiving fluids at [facility], and this went away for a couple days but then seemed to come back again.  Patient reports that she does not feel short of breath at the present time but whenever she gets up and walks she does start to feel short of breath after walking. She notes this is off and on throughout the day and also notably worse at work with standing. No chest pain. No fevers chills or productive cough. No wheezing. No nausea or vomiting. Denies pregnancy today. She does have a history of anemia and wonders if her blood count may be low. She does report that she is occasionally seen a spot or 2 of blood on the toilet paper over the last few weeks and her  doctor wanted her evaluated for possible Crohn's disease she is having some cramping pain in her stomach a week or 2 ago but this is gone away.  No recent surgeries. No leg swelling. The recent long trips or trauma. She does not smoke. She does have an IUD.   Past Medical History  Diagnosis Date  . Anemia     Patient Active Problem List   Diagnosis Date Noted  . Indication for care or intervention related to labor and delivery 12/02/2014  . Indication for care in labor and delivery, antepartum 11/12/2014  . Labor and delivery, indication for care 11/02/2014  . Abdominal pain affecting pregnancy, antepartum 11/02/2014    Past Surgical History  Procedure Laterality Date  . Tonsillectomy      Current Outpatient Rx  Name  Route  Sig  Dispense  Refill  . ibuprofen (ADVIL,MOTRIN) 600 MG tablet   Oral   Take 1 tablet (600 mg total) by mouth every 6 (six) hours.   50 tablet   0   . Prenatal Vit-Fe Fumarate-FA (PRENATAL MULTIVITAMIN) TABS tablet   Oral   Take 1 tablet by mouth daily at 12 noon.           Allergies Review of patient's allergies indicates no known allergies.  Family History  Problem Relation Age of Onset  . Cancer Maternal Grandfather   . Cancer Paternal Grandfather     Social History Social History  Substance Use Topics  . Smoking status: Former  Smoker    Types: Cigarettes  . Smokeless tobacco: Never Used  . Alcohol Use: No    Review of Systems Constitutional: No fever/chills Eyes: No visual changes. ENT: No sore throat. Cardiovascular: see HPI Respiratory: Denies shortness of breath. Gastrointestinal: No abdominal pain.  No nausea, no vomiting in the last couple days. Genitourinary: Negative for dysuria. Musculoskeletal: Negative for back pain. Skin: Negative for rash. Neurological: Negative for headaches, focal weakness or numbness.  10-point ROS otherwise negative.  ____________________________________________   PHYSICAL  EXAM:  VITAL SIGNS: ED Triage Vitals  Enc Vitals Group     BP 04/19/15 1128 120/68 mmHg     Pulse Rate 04/19/15 1128 112     Resp 04/19/15 1128 18     Temp 04/19/15 1128 98 F (36.7 C)     Temp Source 04/19/15 1128 Oral     SpO2 04/19/15 1128 100 %     Weight 04/19/15 1128 110 lb (49.896 kg)     Height 04/19/15 1128 5\' 3"  (1.6 m)     Head Cir --      Peak Flow --      Pain Score --      Pain Loc --      Pain Edu? --      Excl. in GC? --    Constitutional: Alert and oriented. Well appearing and in no acute distress. Eyes: Conjunctivae are normal. PERRL. EOMI. Head: Atraumatic. Nose: No congestion/rhinnorhea. Mouth/Throat: Mucous membranes are moist.  Oropharynx non-erythematous. Neck: No stridor.   Cardiovascular: Normal rate, regular rhythm. Grossly normal heart sounds.  Good peripheral circulation. Respiratory: Normal respiratory effort.  No retractions. Lungs CTAB. Gastrointestinal: Soft and nontender. No distention. No abdominal bruits. No CVA tenderness. Musculoskeletal: No lower extremity tenderness nor edema.  No joint effusions. Neurologic:  Normal speech and language. No gross focal neurologic deficits are appreciated. No gait instability. Skin:  Skin is warm, dry and intact. No rash noted. Psychiatric: Mood and affect are normal. Speech and behavior are normal.  ____________________________________________   LABS (all labs ordered are listed, but only abnormal results are displayed)  Labs Reviewed  CBC - Abnormal; Notable for the following:    WBC 13.1 (*)    Hemoglobin 11.3 (*)    MCV 74.5 (*)    MCH 23.2 (*)    MCHC 31.2 (*)    RDW 19.7 (*)    All other components within normal limits  COMPREHENSIVE METABOLIC PANEL - Abnormal; Notable for the following:    ALT 11 (*)    All other components within normal limits  URINALYSIS COMPLETEWITH MICROSCOPIC ([facility] ONLY) - Abnormal; Notable for the following:    Color, Urine YELLOW (*)    APPearance CLEAR (*)     Ketones, ur TRACE (*)    Leukocytes, UA 1+ (*)    Squamous Epithelial / LPF 0-5 (*)    All other components within normal limits  DIFFERENTIAL - Abnormal; Notable for the following:    Neutro Abs 8.6 (*)    All other components within normal limits  URINE CULTURE  CBC WITH DIFFERENTIAL/PLATELET  POCT PREGNANCY, URINE  POC URINE PREG, ED  TYPE AND SCREEN  ABO/RH   ____________________________________________  EKG  EKG reviewed and interpreted by me at 1550 Normal sinus rhythm Right axis deviation Ventricular rate 90 QRS 90 QTc 470 No evidence of acute ST elevation. No clear evidence of right ventricular or left ventricular hypertrophy. ____________________________________________  RADIOLOGY  Patient did not have any respiratory performed  today as she had a recent CT angiography that was normal. This had a negative CT coronary embolism and I did review it. There was no cardiomegaly. No lymphadenopathy noted. No pulmonary most him. ____________________________________________   PROCEDURES  Procedure(s) performed: None  Critical Care performed: No  ____________________________________________   INITIAL IMPRESSION / ASSESSMENT AND PLAN / ED COURSE  Pertinent labs & imaging results that were available during my care of the patient were reviewed by me and considered in my medical decision making (see chart for details).  Patient has a for ongoing symptoms of lightheadedness and dyspnea. This been ongoing for a month and she has had follow-up, and was a 1. thought possibly due to anemia however this appears to be stable. There is no evidence of acute blood loss and her hemoglobin today is very similar to what it was last checked. She had a CT angiography and recently for the same which was negative, excluding pulmonary emboli. See no evidence of acute cardiomyopathy. Overall this setting to otherwise in V6. The symptoms as she does report her improved after hydration. I  discussed with the patient and her mother and they are following closely with her primary care to resolve this. I do not see any evidence of acute malignancy based on limitations of ER evaluation, no evidence of acute coronary, acute pulmonary embolism. This point we'll hydrate her, reassess for improvement, and likely refer her back to her primary care doctor further management. Patient and family are very agreeable. ____________________________________________   FINAL CLINICAL IMPRESSION(S) / ED DIAGNOSES  Final diagnoses:  Lightheaded        Sharyn Creamer, MD 04/19/15 [phone]

## 2015-04-19 NOTE — ED Notes (Signed)
No sob noted.

## 2015-04-19 NOTE — ED Notes (Signed)
Pt in w/ complaints of shortness of breath, light headedness x 3 weeks; reports near syncopal episode this morning at work.  Pt mothers states "when she is sitting she is fine, but if she is up doing any activity, she is very short of breath."  Pt denies any recent cold/congestion.  Pt reports she is anemic.  Pt A/Ox4, no immediate distress at this time.  Mother at bedside.

## 2015-04-20 LAB — TYPE AND SCREEN
ABO/RH(D): O POS
Antibody Screen: NEGATIVE

## 2015-04-21 LAB — URINE CULTURE

## 2015-09-02 ENCOUNTER — Encounter: Payer: Self-pay | Admitting: *Deleted

## 2015-09-02 ENCOUNTER — Ambulatory Visit: Payer: BLUE CROSS/BLUE SHIELD

## 2015-09-02 ENCOUNTER — Ambulatory Visit
Admission: EM | Admit: 2015-09-02 | Discharge: 2015-09-02 | Disposition: A | Discharge status: Home / Self Care | Payer: BLUE CROSS/BLUE SHIELD | Attending: Emergency Medicine | Admitting: Emergency Medicine

## 2015-09-02 ENCOUNTER — Ambulatory Visit (INDEPENDENT_AMBULATORY_CARE_PROVIDER_SITE_OTHER): Payer: BLUE CROSS/BLUE SHIELD

## 2015-09-02 DIAGNOSIS — S0992XA Unspecified injury of nose, initial encounter: Secondary | ICD-10-CM | POA: Diagnosis not present

## 2015-09-02 DIAGNOSIS — S0083XA Contusion of other part of head, initial encounter: Secondary | ICD-10-CM

## 2015-09-02 MED ORDER — HYDROCODONE-ACETAMINOPHEN 5-325 MG PO TABS: ORAL_TABLET | ORAL | Status: DC

## 2015-09-02 MED ORDER — IBUPROFEN 600 MG PO TABS
600.0000 mg | ORAL_TABLET | Freq: Once | ORAL | Status: AC
  Administered 2015-09-02: 600 mg via ORAL

## 2015-09-02 NOTE — ED Provider Notes (Addendum)
HPI  SUBJECTIVE:  Marilyn Stanley is a 25 y.o. female who presents with clear rhinorrhea, bilateral infraorbital bruising, frontal headache after being hit in the bridge of her nose with the corner of a heavy pallet at work. States that she was wearing safety glasses. Reports some epistaxis which has spontaneously resolved. Now has nasal congestion on the left more than right. No nausea, vomiting, difficulty breathing through her nose. She denies any other injury to her face.  Also reports that her left eye hurts with blinking. Describes the pain as a soreness. She denies foreign body sensation, photophobia, visual changes, increased tearing. She wears contacts. She does not wear glasses. Patient has decided to not file Worker's Compensation. Past medical history negative. LMP: Patient has an IUD, denies possibility pregnant. PMD: None.   Past Medical History  Diagnosis Date  . Anemia     Past Surgical History  Procedure Laterality Date  . Tonsillectomy      Family History  Problem Relation Age of Onset  . Cancer Maternal Grandfather   . Cancer Paternal Grandfather     Social History  Substance Use Topics  . Smoking status: Former Smoker    Types: Cigarettes  . Smokeless tobacco: Never Used  . Alcohol Use: No    No current facility-administered medications for this encounter.  Current outpatient prescriptions:  .  HYDROcodone-acetaminophen (NORCO/VICODIN) 5-325 MG tablet, 1-2 tabs q 6hr prn pain, Disp: 20 tablet, Rfl: 0 .  ibuprofen (ADVIL,MOTRIN) 600 MG tablet, Take 1 tablet (600 mg total) by mouth every 6 (six) hours., Disp: 50 tablet, Rfl: 0 .  Prenatal Vit-Fe Fumarate-FA (PRENATAL MULTIVITAMIN) TABS tablet, Take 1 tablet by mouth daily at 12 noon., Disp: , Rfl:   Allergies  Allergen Reactions  . Tramadol Rash     ROS  As noted in HPI.   Physical Exam  BP 125/73 mmHg  Pulse 93  Temp(Src) 97.8 F (36.6 C) (Oral)  Resp 18  Ht 5\' 3"  (1.6 m)  Wt 113 lb (51.256  kg)  BMI 20.02 kg/m2  SpO2 100%  Constitutional: Well developed, well nourished, no acute distress Eyes:  EOMI, conjunctiva normal bilaterally, PERRLA. Mild Consensual photophobia, no direct photophobia. No abrasion seen on fluorescein exam. No foreign body noted on lid eversion. corrected visual acuity right 20/20, left 20/20, bilateral 20/15.  HENT: Normocephalic, bilateral infraorbital ecchymosis/raccoon eyes. Positive nasal bridge tenderness. Positive bilateral infraorbital tenderness. No epistaxis. Mild nasal congestion worse on the left.  Questionable septal hematoma right side.  Normal dentition. No hemotympanum  Respiratory: Normal inspiratory effort Cardiovascular: Normal rate GI: nondistended skin: No rash, skin intact Musculoskeletal: no deformities Neurologic: Alert & oriented x 3, no focal neuro deficits Psychiatric: Speech and behavior appropriate   ED Course   Medications  ibuprofen (ADVIL,MOTRIN) tablet 600 mg (600 mg Oral Given 09/02/15 1746)    Orders Placed This Encounter  Procedures  . DG Nasal Bones    Standing Status: Standing     Number of Occurrences: 1     Standing Expiration Date:     Order Specific Question:  Reason for Exam (SYMPTOM  OR DIAGNOSIS REQUIRED)    Answer:  r/o nasal fx,  . Ambulatory referral to ENT    Referral Priority:  Urgent    Referral Type:  Consultation    Referral Reason:  Specialty Services Required    Requested Specialty:  Otolaryngology    Number of Visits Requested:  1    No results found for this or any  previous visit (from the past 24 hour(s)). Dg Nasal Bones  09/02/2015  CLINICAL DATA:  Injury to the nose. EXAM: NASAL BONES - 3+ VIEW COMPARISON:  None. FINDINGS: There is no evidence of fracture or other bone abnormality. IMPRESSION: Negative. Electronically Signed   By: Ted Mcalpine M.D.   On: 09/02/2015 18:25    ED Clinical Impression  Facial bruising, initial encounter  Nasal trauma, initial  encounter   ED Assessment/Plan  Getting nasal bone imaging to rule out fracture.  Imaging independently reviewed. No nasal fracture. See radiology report for details. Concern for septal hematoma. Discussed case with Dr. Jenne Campus, ENT on call. They will see her tomorrow.  instructed patient to call first thing in am. We'll also have staff call Dr. Franco Collet, office to arrange follow up appointment tomorrow.   Home with ibuprofen, norco, Systane eyedrops. in the absence of corneal abrasion, do not see the necessity for antibiotic eyedrops at this time. No definitive evidence for iritis. also providing primary care referral for routine medical care.   Discussed imaging, MDM, plan and followup with patient. Discussed sn/sx that should prompt return to the ED. Patient agrees with plan.   *This clinic note was created using Dragon dictation software. Therefore, there may be occasional mistakes despite careful proofreading.  ?    Domenick Gong, MD 09/02/15 1846  Domenick Gong, MD 09/02/15 [phone]

## 2015-09-02 NOTE — Discharge Instructions (Signed)
Take the medication as written. Take 1 gram of tylenol with 600 mg motrin up to 4 times a day as needed for pain. This with  is an effective combination for pain. Take the hydrocodone/norco/percocet only for severe pain. Do not take the tylenol and hydrocodone/norco/percocet as they both have tylenol in them and too much can hurt your liver. Do not exceed 4 grams of tylenol a day from all sources. Follow up with Dr. Elenore RotaJuengel tomorrow. Call the [place] office if [facility]. We will also have the staff contact him as well. Go to the ER for the signs and symptoms we discussed.  Follow-up with a primary care physician of your choice for routine care. I've given you 3 physicians who are taking new patients.  Go to www.goodrx.com to look up your medications. This will give you a list of where you can find your prescriptions at the most affordable prices.

## 2015-09-02 NOTE — ED Notes (Addendum)
Patient injured the bridge of her nose while at work today @ 1500 hrs. Patient was removing shrink rap for a pallet of eggs when a corner brace broad fell from the pallet and hit her safety glasses. The safety glasses caused the injury to her nose. Patient also reports that her left eye hurts when she blinks.

## 2015-12-01 ENCOUNTER — Encounter: Payer: Self-pay | Admitting: *Deleted

## 2015-12-01 ENCOUNTER — Ambulatory Visit
Admission: EM | Admit: 2015-12-01 | Discharge: 2015-12-01 | Disposition: A | Discharge status: Home / Self Care | Payer: BLUE CROSS/BLUE SHIELD | Attending: Family Medicine | Admitting: Family Medicine

## 2015-12-01 DIAGNOSIS — J029 Acute pharyngitis, unspecified: Secondary | ICD-10-CM

## 2015-12-01 DIAGNOSIS — J020 Streptococcal pharyngitis: Secondary | ICD-10-CM | POA: Diagnosis not present

## 2015-12-01 LAB — MONONUCLEOSIS SCREEN: MONO SCREEN: NEGATIVE

## 2015-12-01 LAB — RAPID STREP SCREEN (MED CTR MEBANE ONLY): STREPTOCOCCUS, GROUP A SCREEN (DIRECT): POSITIVE — AB

## 2015-12-01 MED ORDER — LIDOCAINE VISCOUS 2 % MT SOLN: OROMUCOSAL | 0 refills | Status: AC

## 2015-12-01 MED ORDER — PENICILLIN G BENZATHINE 1200000 UNIT/2ML IM SUSP
1.2000 10*6.[IU] | Freq: Once | INTRAMUSCULAR | Status: AC
  Administered 2015-12-01: 1.2 10*6.[IU] via INTRAMUSCULAR

## 2015-12-01 NOTE — ED Provider Notes (Signed)
[facility]    CSN: [id number] Arrival date & time: 12/01/15  1846  First Provider Contact:  None       History   Chief Complaint Chief Complaint  Patient presents with  . Sore Throat    HPI Marilyn Stanley is a 25 y.o. female.   The history is provided by the patient.  Sore Throat  This is a new problem. The current episode started more than 2 days ago. The problem occurs constantly. The problem has been gradually worsening. Pertinent negatives include no chest pain, no abdominal pain, no headaches and no shortness of breath. The symptoms are aggravated by swallowing. Nothing relieves the symptoms.    Past Medical History:  Diagnosis Date  . Anemia     Patient Active Problem List   Diagnosis Date Noted  . Indication for care or intervention related to labor and delivery 12/02/2014  . Indication for care in labor and delivery, antepartum 11/12/2014  . Labor and delivery, indication for care 11/02/2014  . Abdominal pain affecting pregnancy, antepartum 11/02/2014    Past Surgical History:  Procedure Laterality Date  . TONSILLECTOMY      OB History    Gravida Para Term Preterm AB Living   3 2 2   1 2    SAB TAB Ectopic Multiple Live Births         0 2       Home Medications    Prior to Admission medications   Medication Sig Start Date End Date Taking? Authorizing Provider  HYDROcodone-acetaminophen (NORCO/VICODIN) 5-325 MG tablet 1-2 tabs q 6hr prn pain 09/02/15   Domenick GongAshley Mortenson, MD  ibuprofen (ADVIL,MOTRIN) 600 MG tablet Take 1 tablet (600 mg total) by mouth every 6 (six) hours. 12/04/14   Nadara Mustardobert P Harris, MD  lidocaine (XYLOCAINE) 2 % solution 20 ml gargle and spit q 6 hours prn sore throat 12/01/15   Payton Mccallumrlando Sravya Grissom, MD  Prenatal Vit-Fe Fumarate-FA (PRENATAL MULTIVITAMIN) TABS tablet Take 1 tablet by mouth daily at 12 noon.    Historical Provider, MD    Family History Family History  Problem Relation Age of Onset  . Cancer Maternal Grandfather    . Cancer Paternal Grandfather     Social History Social History  Substance Use Topics  . Smoking status: Former Smoker    Types: Cigarettes  . Smokeless tobacco: Never Used  . Alcohol use No     Allergies   Tramadol   Review of Systems Review of Systems  Respiratory: Negative for shortness of breath.   Cardiovascular: Negative for chest pain.  Gastrointestinal: Negative for abdominal pain.  Neurological: Negative for headaches.     Physical Exam Triage Vital Signs ED Triage Vitals  Enc Vitals Group     BP 12/01/15 1909 123/86     Pulse Rate 12/01/15 1909 (!) 102     Resp 12/01/15 1909 18     Temp 12/01/15 1909 98.8 F (37.1 C)     Temp Source 12/01/15 1909 Oral     SpO2 12/01/15 1909 100 %     Weight 12/01/15 1909 110 lb (49.9 kg)     Height 12/01/15 1909 5\' 4"  (1.626 m)     Head Circumference --      Peak Flow --      Pain Score 12/01/15 1914 7     Pain Loc --      Pain Edu? --      Excl. in GC? --    No data  found.   Updated Vital Signs BP 123/86 (BP Location: Left Arm)   Pulse (!) 102   Temp 98.8 F (37.1 C) (Oral)   Resp 18   Ht 5\' 4"  (1.626 m)   Wt 110 lb (49.9 kg)   SpO2 100%   BMI 18.88 kg/m   Visual Acuity Right Eye Distance:   Left Eye Distance:   Bilateral Distance:    Right Eye Near:   Left Eye Near:    Bilateral Near:     Physical Exam  Constitutional: She appears well-developed and well-nourished. No distress.  HENT:  Head: Normocephalic and atraumatic.  Right Ear: Tympanic membrane, external ear and ear canal normal.  Left Ear: Tympanic membrane, external ear and ear canal normal.  Nose: No mucosal edema, rhinorrhea, nose lacerations, sinus tenderness, nasal deformity, septal deviation or nasal septal hematoma. No epistaxis.  No foreign bodies. Right sinus exhibits no maxillary sinus tenderness and no frontal sinus tenderness. Left sinus exhibits no maxillary sinus tenderness and no frontal sinus tenderness.  Mouth/Throat:  Uvula is midline and mucous membranes are normal. Posterior oropharyngeal erythema present. No oropharyngeal exudate.  Eyes: Conjunctivae and EOM are normal. Pupils are equal, round, and reactive to light. Right eye exhibits no discharge. Left eye exhibits no discharge. No scleral icterus.  Neck: Normal range of motion. Neck supple. No thyromegaly present.  Cardiovascular: Normal rate, regular rhythm and normal heart sounds.   Pulmonary/Chest: Effort normal and breath sounds normal. No respiratory distress. She has no wheezes. She has no rales.  Lymphadenopathy:    She has no cervical adenopathy.  Skin: She is not diaphoretic.  Nursing note and vitals reviewed.    UC Treatments / Results  Labs (all labs ordered are listed, but only abnormal results are displayed) Labs Reviewed  RAPID STREP SCREEN (NOT AT [facility]) - Abnormal; Notable for the following:       Result Value   Streptococcus, Group A Screen (Direct) POSITIVE (*)    All other components within normal limits  MONONUCLEOSIS SCREEN    EKG  EKG Interpretation None       Radiology No results found.  Procedures Procedures (including critical care time)  Medications Ordered in UC Medications  penicillin g benzathine (BICILLIN LA) 1200000 UNIT/2ML injection 1.2 Million Units (not administered)     Initial Impression / Assessment and Plan / UC Course  I have reviewed the triage vital signs and the nursing notes.  Pertinent labs & imaging results that were available during my care of the patient were reviewed by me and considered in my medical decision making (see chart for details).  Clinical Course      Final Clinical Impressions(s) / UC Diagnoses   Final diagnoses:  Strep throat    New Prescriptions New Prescriptions   LIDOCAINE (XYLOCAINE) 2 % SOLUTION    20 ml gargle and spit q 6 hours prn sore throat   1. Lab results and diagnosis reviewed with patient 2. rx as per orders above; reviewed possible side  effects, interactions, risks and benefits  3. Recommend supportive treatment with salt water gargles, otc analgesics prn 4. Patient given Bicillin LA 1.572mU IM x 1 4. Follow-up prn if symptoms worsen or don't improve    Payton Mccallumrlando Jonice Cerra, MD 12/01/15 2003

## 2015-12-01 NOTE — ED Triage Notes (Signed)
Patient suddenly started having symptom of sore throat on her left side 3 days ago. Patient reports that it is painful to swallow or move her neck. Possible fever symptoms, but no other symptoms reported.

## 2015-12-22 ENCOUNTER — Ambulatory Visit
Admission: EM | Admit: 2015-12-22 | Discharge: 2015-12-22 | Disposition: A | Discharge status: Home / Self Care | Payer: BLUE CROSS/BLUE SHIELD | Attending: Family Medicine | Admitting: Family Medicine

## 2015-12-22 DIAGNOSIS — S0181XA Laceration without foreign body of other part of head, initial encounter: Secondary | ICD-10-CM

## 2015-12-22 DIAGNOSIS — Z23 Encounter for immunization: Secondary | ICD-10-CM | POA: Diagnosis not present

## 2015-12-22 DIAGNOSIS — S0083XA Contusion of other part of head, initial encounter: Secondary | ICD-10-CM | POA: Diagnosis not present

## 2015-12-22 MED ORDER — TETANUS-DIPHTH-ACELL PERTUSSIS 5-2.5-18.5 LF-MCG/0.5 IM SUSP
0.5000 mL | Freq: Once | INTRAMUSCULAR | Status: AC
  Administered 2015-12-22: 0.5 mL via INTRAMUSCULAR

## 2015-12-22 NOTE — ED Notes (Signed)
Wounds cleansed with Hibaclens/Saline. Ice pack applied to wound

## 2015-12-22 NOTE — ED Provider Notes (Signed)
[facility]    CSN: [id number] Arrival date & time: 12/22/15  1516  First Provider Contact:  None       History   Chief Complaint Chief Complaint  Patient presents with  . Head Laceration    HPI Burtis JunesJessica Stanley is a 25 y.o. female.   HPI  Past Medical History:  Diagnosis Date  . Anemia     Patient Active Problem List   Diagnosis Date Noted  . Indication for care or intervention related to labor and delivery 12/02/2014  . Indication for care in labor and delivery, antepartum 11/12/2014  . Labor and delivery, indication for care 11/02/2014  . Abdominal pain affecting pregnancy, antepartum 11/02/2014    Past Surgical History:  Procedure Laterality Date  . TONSILLECTOMY      OB History    Gravida Para Term Preterm AB Living   3 2 2   1 2    SAB TAB Ectopic Multiple Live Births         0 2       Home Medications    Prior to Admission medications   Medication Sig Start Date End Date Taking? Authorizing Provider  HYDROcodone-acetaminophen (NORCO/VICODIN) 5-325 MG tablet 1-2 tabs q 6hr prn pain 09/02/15   Domenick GongAshley Mortenson, MD  ibuprofen (ADVIL,MOTRIN) 600 MG tablet Take 1 tablet (600 mg total) by mouth every 6 (six) hours. 12/04/14   Marilyn Mustardobert P Harris, MD  lidocaine (XYLOCAINE) 2 % solution 20 ml gargle and spit q 6 hours prn sore throat 12/01/15   Marilyn Mccallumrlando Tejas Seawood, MD  Prenatal Vit-Fe Fumarate-FA (PRENATAL MULTIVITAMIN) TABS tablet Take 1 tablet by mouth daily at 12 noon.    Historical Provider, MD    Family History Family History  Problem Relation Age of Onset  . Cancer Maternal Grandfather   . Cancer Paternal Grandfather     Social History Social History  Substance Use Topics  . Smoking status: Former Smoker    Types: Cigarettes  . Smokeless tobacco: Never Used  . Alcohol use No     Allergies   Tramadol   Review of Systems Review of Systems   Physical Exam Triage Vital Signs ED Triage Vitals  Enc Vitals Group     BP 12/22/15 1601  (!) 131/91     Pulse Rate 12/22/15 1601 91     Resp 12/22/15 1601 18     Temp 12/22/15 1601 98 F (36.7 C)     Temp Source 12/22/15 1601 Oral     SpO2 12/22/15 1601 100 %     Weight 12/22/15 1602 115 lb (52.2 kg)     Height 12/22/15 1602 5\' 3"  (1.6 m)     Head Circumference --      Peak Flow --      Pain Score 12/22/15 1603 10     Pain Loc --      Pain Edu? --      Excl. in GC? --    No data found.   Updated Vital Signs BP (!) 131/91 (BP Location: Left Arm)   Pulse 91   Temp 98 F (36.7 C) (Oral)   Resp 18   Ht 5\' 3"  (1.6 m)   Wt 115 lb (52.2 kg)   SpO2 100%   BMI 20.37 kg/m   Visual Acuity Right Eye Distance:   Left Eye Distance:   Bilateral Distance:    Right Eye Near:   Left Eye Near:    Bilateral Near:     Physical  Exam  Constitutional: She appears well-developed and well-nourished. No distress.  HENT:  Head: Normocephalic.  Right Ear: External ear normal.  Left Ear: External ear normal.  Mouth/Throat: Oropharynx is clear and moist.  Eyes: EOM are normal. Pupils are equal, round, and reactive to light.  Neck: Normal range of motion. Neck supple.  Skin: She is not diaphoretic.  1.5cm and 1cm superficial lacerations to right upper forehead  Vitals reviewed.    UC Treatments / Results  Labs (all labs ordered are listed, but only abnormal results are displayed) Labs Reviewed - No data to display  EKG  EKG Interpretation None       Radiology No results found.  Procedures .Marland KitchenLaceration Repair Date/Time: 12/22/2015 5:44 PM Performed by: Marilyn Stanley Authorized by: Marilyn Stanley   Consent:    Consent obtained:  Verbal   Consent given by:  Patient   Risks discussed:  Infection, pain and poor cosmetic result   Alternatives discussed:  No treatment Anesthesia (see MAR for exact dosages):    Anesthesia method:  None Laceration details:    Location:  Face   Face location:  Forehead   Wound length (cm): 2 small lacerations (1.5cm and 1cm)    Laceration depth: very superficial  Repair type:    Repair type:  Simple Pre-procedure details:    Preparation:  Patient was prepped and draped in usual sterile fashion Exploration:    Wound exploration: entire depth of wound probed and visualized     Contaminated: no   Treatment:    Area cleansed with:  Hibiclens   Amount of cleaning:  Standard Skin repair:    Repair method:  Tissue adhesive Approximation:    Approximation:  Close Post-procedure details:    Dressing:  Open (no dressing)   Patient tolerance of procedure:  Tolerated well, no immediate complications   (including critical care time)  Medications Ordered in UC Medications  Tdap (BOOSTRIX) injection 0.5 mL (0.5 mLs Intramuscular Given 12/22/15 1609)     Initial Impression / Assessment and Plan / UC Course  I have reviewed the triage vital signs and the nursing notes.  Pertinent labs & imaging results that were available during my care of the patient were reviewed by me and considered in my medical decision making (see chart for details).  Clinical Course      Final Clinical Impressions(s) / UC Diagnoses   Final diagnoses:  Forehead laceration, initial encounter  Forehead contusion, initial encounter    New Prescriptions Discharge Medication List as of 12/22/2015  4:32 PM     1.diagnosis reviewed with patient 2. Laceration repair with tissue adhesive as above 3. Recommend supportive treatment with wound care; monitoring  4. Follow-up prn if symptoms worsen or don't improve   Marilyn Mccallum, MD 12/22/15 1747

## 2015-12-22 NOTE — ED Triage Notes (Signed)
Pt was cleaning ceiling fan and didn't realize it was on. Was hit with the fan blades. No LOC. 2 areas of injury noted to right forehead. Bruising/swelling to areas with 2 lacerations. Bleeding controlled. Larger laceration approx. 1/2" in length.

## 2016-05-11 ENCOUNTER — Encounter: Payer: Self-pay | Admitting: *Deleted

## 2016-05-11 ENCOUNTER — Ambulatory Visit
Admission: EM | Admit: 2016-05-11 | Discharge: 2016-05-11 | Disposition: A | Discharge status: Other Acute Inpatient Hospital | Payer: Self-pay | Attending: Internal Medicine | Admitting: Internal Medicine

## 2016-05-11 DIAGNOSIS — R1031 Right lower quadrant pain: Secondary | ICD-10-CM

## 2016-05-11 LAB — URINALYSIS, COMPLETE (UACMP) WITH MICROSCOPIC
BILIRUBIN URINE: NEGATIVE
Glucose, UA: NEGATIVE mg/dL
Hgb urine dipstick: NEGATIVE
KETONES UR: NEGATIVE mg/dL
Nitrite: NEGATIVE
PROTEIN: NEGATIVE mg/dL
Specific Gravity, Urine: 1.01 (ref 1.005–1.030)
pH: 5.5 (ref 5.0–8.0)

## 2016-05-11 NOTE — ED Triage Notes (Signed)
RLQ abd pain x3-5 days. N/V/D, fever (102.1).

## 2016-05-11 NOTE — ED Provider Notes (Signed)
[facility]    CSN: [id number] Arrival date & time: 05/11/16  1640     History   Chief Complaint Chief Complaint  Patient presents with  . Abdominal Pain  . Nausea  . Emesis  . Diarrhea    HPI Marilyn Stanley is a 26 y.o. female.   Fever, abdominal pain, N/V/D x3 or 4 days.  Patient states the pain is in her RLQ.  Admits that it hurts her to roll out of bed.        Past Medical History:  Diagnosis Date  . Anemia     Patient Active Problem List   Diagnosis Date Noted  . Indication for care or intervention related to labor and delivery 12/02/2014  . Indication for care in labor and delivery, antepartum 11/12/2014  . Labor and delivery, indication for care 11/02/2014  . Abdominal pain affecting pregnancy, antepartum 11/02/2014    Past Surgical History:  Procedure Laterality Date  . TONSILLECTOMY      OB History    Gravida Para Term Preterm AB Living   3 2 2   1 2    SAB TAB Ectopic Multiple Live Births         0 2       Home Medications    Prior to Admission medications   Medication Sig Start Date End Date Taking? Authorizing Provider  ibuprofen (ADVIL,MOTRIN) 600 MG tablet Take 1 tablet (600 mg total) by mouth every 6 (six) hours. 12/04/14  Yes Nadara Mustard, MD  HYDROcodone-acetaminophen (NORCO/VICODIN) 5-325 MG tablet 1-2 tabs q 6hr prn pain 09/02/15   Domenick Gong, MD  lidocaine (XYLOCAINE) 2 % solution 20 ml gargle and spit q 6 hours prn sore throat 12/01/15   [name], MD  Prenatal Vit-Fe Fumarate-FA (PRENATAL MULTIVITAMIN) TABS tablet Take 1 tablet by mouth daily at 12 noon.    Historical Provider, MD    Family History Family History  Problem Relation Age of Onset  . Cancer Maternal Grandfather   . Cancer Paternal Grandfather     Social History Social History  Substance Use Topics  . Smoking status: Former Smoker    Types: Cigarettes  . Smokeless tobacco: Never Used  . Alcohol use No     Allergies    Tramadol   Review of Systems Review of Systems  Constitutional: Negative for chills and fever.  HENT: Negative for sore throat and tinnitus.   Eyes: Negative for redness.  Respiratory: Negative for cough and shortness of breath.   Cardiovascular: Negative for chest pain and palpitations.  Gastrointestinal: Positive for abdominal pain, diarrhea, nausea and vomiting. Negative for blood in stool.  Genitourinary: Negative for dysuria, frequency and urgency.  Musculoskeletal: Negative for myalgias.  Skin: Negative for rash.       No lesions  Neurological: Negative for weakness.  Hematological: Does not bruise/bleed easily.  Psychiatric/Behavioral: Negative for suicidal ideas.     Physical Exam Triage Vital Signs ED Triage Vitals  Enc Vitals Group     BP 05/11/16 1743 (!) 130/92     Pulse Rate 05/11/16 1743 94     Resp 05/11/16 1743 16     Temp 05/11/16 1743 98.2 F (36.8 C)     Temp Source 05/11/16 1743 Oral     SpO2 05/11/16 1743 100 %     Weight 05/11/16 1744 120 lb (54.4 kg)     Height 05/11/16 1744 5\' 3"  (1.6 m)     Head Circumference --  Peak Flow --      Pain Score --      Pain Loc --      Pain Edu? --      Excl. in GC? --    No data found.   Updated Vital Signs BP (!) 130/92 (BP Location: Left Arm)   Pulse 94   Temp 98.2 F (36.8 C) (Oral)   Resp 16   Ht 5\' 3"  (1.6 m)   Wt 120 lb (54.4 kg)   SpO2 100%   BMI 21.26 kg/m   Visual Acuity Right Eye Distance:   Left Eye Distance:   Bilateral Distance:    Right Eye Near:   Left Eye Near:    Bilateral Near:     Physical Exam  Constitutional: She is oriented to person, place, and time. She appears well-developed and well-nourished. No distress.  HENT:  Head: Normocephalic and atraumatic.  Mouth/Throat: Oropharynx is clear and moist.  Eyes: Conjunctivae and EOM are normal. Pupils are equal, round, and reactive to light. No scleral icterus.  Neck: Normal range of motion. Neck supple. No JVD present.  No tracheal deviation present. No thyromegaly present.  Cardiovascular: Normal rate, regular rhythm and normal heart sounds.  Exam reveals no gallop and no friction rub.   No murmur heard. Pulmonary/Chest: Effort normal and breath sounds normal.  Abdominal: Soft. Bowel sounds are normal. She exhibits no distension and no mass. There is tenderness. There is rebound. There is no guarding.  Musculoskeletal: Normal range of motion. She exhibits no edema.  Lymphadenopathy:    She has no cervical adenopathy.  Neurological: She is alert and oriented to person, place, and time. No cranial nerve deficit.  Skin: Skin is warm and dry.  Psychiatric: She has a normal mood and affect. Her behavior is normal. Judgment and thought content normal.  Nursing note and vitals reviewed.    UC Treatments / Results  Labs (all labs ordered are listed, but only abnormal results are displayed) Labs Reviewed  URINALYSIS, COMPLETE (UACMP) WITH MICROSCOPIC - Abnormal; Notable for the following:       Result Value   Leukocytes, UA SMALL (*)    Squamous Epithelial / LPF 0-5 (*)    Bacteria, UA RARE (*)    All other components within normal limits    EKG  EKG Interpretation None       Radiology No results found.  Procedures Procedures (including critical care time)  Medications Ordered in UC Medications - No data to display   Initial Impression / Assessment and Plan / UC Course  I have reviewed the triage vital signs and the nursing notes.  Pertinent labs & imaging results that were available during my care of the patient were reviewed by me and considered in my medical decision making (see chart for details).     Due to concern for appendicitis recommend patient go to the ED for contrast abdominal CT scan   Final Clinical Impressions(s) / UC Diagnoses   Final diagnoses:  RLQ abdominal pain    New Prescriptions New Prescriptions   No medications on file     Arnaldo NatalMichael S Sameer Teeple,  MD 05/11/16 1824

## 2016-05-12 ENCOUNTER — Encounter: Payer: Self-pay | Admitting: Emergency Medicine

## 2016-05-12 ENCOUNTER — Emergency Department
Admission: EM | Admit: 2016-05-12 | Discharge: 2016-05-12 | Disposition: A | Discharge status: Home / Self Care | Payer: Self-pay | Attending: Emergency Medicine | Admitting: Emergency Medicine

## 2016-05-12 ENCOUNTER — Emergency Department: Payer: Self-pay

## 2016-05-12 DIAGNOSIS — R197 Diarrhea, unspecified: Secondary | ICD-10-CM | POA: Insufficient documentation

## 2016-05-12 DIAGNOSIS — R1031 Right lower quadrant pain: Secondary | ICD-10-CM | POA: Insufficient documentation

## 2016-05-12 DIAGNOSIS — Z87891 Personal history of nicotine dependence: Secondary | ICD-10-CM | POA: Insufficient documentation

## 2016-05-12 DIAGNOSIS — R112 Nausea with vomiting, unspecified: Secondary | ICD-10-CM | POA: Insufficient documentation

## 2016-05-12 LAB — URINALYSIS, COMPLETE (UACMP) WITH MICROSCOPIC
BACTERIA UA: NONE SEEN
Bilirubin Urine: NEGATIVE
GLUCOSE, UA: NEGATIVE mg/dL
Hgb urine dipstick: NEGATIVE
KETONES UR: NEGATIVE mg/dL
Leukocytes, UA: NEGATIVE
Nitrite: NEGATIVE
PROTEIN: NEGATIVE mg/dL
RBC / HPF: NONE SEEN RBC/hpf (ref 0–5)
Specific Gravity, Urine: 1.002 — ABNORMAL LOW (ref 1.005–1.030)
pH: 6 (ref 5.0–8.0)

## 2016-05-12 LAB — COMPREHENSIVE METABOLIC PANEL
ALBUMIN: 4.6 g/dL (ref 3.5–5.0)
ALT: 18 U/L (ref 14–54)
ANION GAP: 8 (ref 5–15)
AST: 33 U/L (ref 15–41)
Alkaline Phosphatase: 90 U/L (ref 38–126)
BUN: 8 mg/dL (ref 6–20)
CHLORIDE: 106 mmol/L (ref 101–111)
CO2: 23 mmol/L (ref 22–32)
Calcium: 9 mg/dL (ref 8.9–10.3)
Creatinine, Ser: 0.91 mg/dL (ref 0.44–1.00)
GFR calc non Af Amer: >60 mL/min (ref >60)
GLUCOSE: 81 mg/dL (ref 65–99)
Potassium: 4.4 mmol/L (ref 3.5–5.1)
SODIUM: 137 mmol/L (ref 135–145)
Total Bilirubin: 0.9 mg/dL (ref 0.3–1.2)
Total Protein: 8 g/dL (ref 6.5–8.1)

## 2016-05-12 LAB — CBC
HEMATOCRIT: 40.5 % (ref 35.0–47.0)
HEMOGLOBIN: 13.3 g/dL (ref 12.0–16.0)
MCH: 29 pg (ref 26.0–34.0)
MCHC: 32.7 g/dL (ref 32.0–36.0)
MCV: 88.7 fL (ref 80.0–100.0)
Platelets: 215 10*3/uL (ref 150–440)
RBC: 4.57 MIL/uL (ref 3.80–5.20)
RDW: 17.8 % — ABNORMAL HIGH (ref 11.5–14.5)
WBC: 10 10*3/uL (ref 3.6–11.0)

## 2016-05-12 LAB — LIPASE, BLOOD: LIPASE: 23 U/L (ref 11–51)

## 2016-05-12 MED ORDER — IBUPROFEN 800 MG PO TABS: 800.0000 mg | ORAL_TABLET | Freq: Three times a day (TID) | ORAL | 0 refills | Status: AC | PRN

## 2016-05-12 MED ORDER — ONDANSETRON HCL 4 MG/2ML IJ SOLN
4.0000 mg | Freq: Once | INTRAMUSCULAR | Status: AC
  Administered 2016-05-12: 4 mg via INTRAVENOUS
  Filled 2016-05-12: qty 2

## 2016-05-12 MED ORDER — KETOROLAC TROMETHAMINE 30 MG/ML IJ SOLN
15.0000 mg | Freq: Once | INTRAMUSCULAR | Status: AC
  Administered 2016-05-12: 15 mg via INTRAVENOUS
  Filled 2016-05-12: qty 1

## 2016-05-12 MED ORDER — IOPAMIDOL (ISOVUE-300) INJECTION 61%
100.0000 mL | Freq: Once | INTRAVENOUS | Status: AC | PRN
  Administered 2016-05-12: 100 mL via INTRAVENOUS

## 2016-05-12 MED ORDER — IOPAMIDOL (ISOVUE-300) INJECTION 61%
30.0000 mL | Freq: Once | INTRAVENOUS | Status: AC | PRN
  Administered 2016-05-12: 30 mL via ORAL

## 2016-05-12 MED ORDER — ONDANSETRON HCL 4 MG PO TABS: 4.0000 mg | ORAL_TABLET | Freq: Three times a day (TID) | ORAL | 0 refills | Status: AC | PRN

## 2016-05-12 NOTE — Discharge Instructions (Signed)
Please return emergency Department for high fever, bloody diarrhea, increasing abdominal pain or any other concerns.  Please return immediately if condition worsens. Please contact her primary physician or the physician you were given for referral. If you have any specialist physicians involved in her treatment and plan please also contact them. Thank you for using [facility].

## 2016-05-12 NOTE — ED Notes (Signed)
POC preg  Negative  

## 2016-05-12 NOTE — ED Triage Notes (Signed)
Pt complains of RLQ abdominal pain with N/V/D for 4 days. Pt states she was seen at urgent care and told to come here for eval. for poss. Appendicitis.

## 2016-05-12 NOTE — ED Provider Notes (Signed)
Time Seen: Approximately 1029  I have reviewed the triage notes  Chief Complaint: Abdominal Pain   History of Present Illness: Marilyn Stanley is a 26 y.o. female  who was referred here from a local outpatient clinic for evaluation of right lower quadrant abdominal pain. States she's had the pain on and off now for the last 4 days but is relatively constant for the last 24 hours. She states she's had nausea vomiting multiple times and had no bowel and has had some loose watery stool with no melena or hematochezia. She denies any recent travel or antibiotic therapy. Mainly to the right lower quadrant without radiation to the left side or up into the flank or back region. Denies any unusual vaginal discharge or bleeding.   Past Medical History:  Diagnosis Date  . Anemia     Patient Active Problem List   Diagnosis Date Noted  . Indication for care or intervention related to labor and delivery 12/02/2014  . Indication for care in labor and delivery, antepartum 11/12/2014  . Labor and delivery, indication for care 11/02/2014  . Abdominal pain affecting pregnancy, antepartum 11/02/2014    Past Surgical History:  Procedure Laterality Date  . TONSILLECTOMY      Past Surgical History:  Procedure Laterality Date  . TONSILLECTOMY      Current Outpatient Rx  . Order #: [id number] Class: Print  . Order #: [id number] Class: Normal    Allergies:  Tramadol  Family History: Family History  Problem Relation Age of Onset  . Cancer Maternal Grandfather   . Cancer Paternal Grandfather     Social History: Social History  Substance Use Topics  . Smoking status: Former Smoker    Types: Cigarettes  . Smokeless tobacco: Never Used  . Alcohol use No     Review of Systems:   10 point review of systems was performed and was otherwise negative:  Constitutional: No fever Eyes: No visual disturbances ENT: No sore throat, ear pain Cardiac: No chest pain Respiratory: No shortness of  breath, wheezing, or stridor Abdomen: Right lower quadrant pain with nausea vomiting and diarrhea Endocrine: No weight loss, No night sweats Extremities: No peripheral edema, cyanosis Skin: No rashes, easy bruising Neurologic: No focal weakness, trouble with speech or swollowing Urologic: No dysuria, Hematuria, or urinary frequency   Physical Exam:  ED Triage Vitals  Enc Vitals Group     BP 05/12/16 0941 (!) 149/89     Pulse Rate 05/12/16 0941 91     Resp 05/12/16 0941 18     Temp 05/12/16 0941 98 F (36.7 C)     Temp src --      SpO2 05/12/16 0941 100 %     Weight 05/12/16 0942 125 lb (56.7 kg)     Height 05/12/16 0942 5\' 3"  (1.6 m)     Head Circumference --      Peak Flow --      Pain Score 05/12/16 0942 7     Pain Loc --      Pain Edu? --      Excl. in GC? --     General: Awake , Alert , and Oriented times 3; GCS 15 Head: Normal cephalic , atraumatic Eyes: Pupils equal , round, reactive to light Nose/Throat: No nasal drainage, patent upper airway without erythema or exudate.  Neck: Supple, Full range of motion, No anterior adenopathy or palpable thyroid masses Lungs: Clear to ascultation without wheezes , rhonchi, or rales Heart: Regular rate, regular rhythm  without murmurs , gallops , or rubs Abdomen: Mild tenderness to deep palpation with some mild radiation of pain toward the right side. Patient has a negative Rovsing sign. Negative cerebellar has negative obturator sign. Bowel sounds are positive and symmetric in all 4 quadrants. No focal tenderness over the right upper quadrant. .        Extremities: 2 plus symmetric pulses. No edema, clubbing or cyanosis Neurologic: normal ambulation, Motor symmetric without deficits, sensory intact Skin: warm, dry, no rashes   Labs:   All laboratory work was reviewed including any pertinent negatives or positives listed below:  Labs Reviewed  CBC - Abnormal; Notable for the following:       Result Value   RDW 17.8 (*)     All other components within normal limits  URINALYSIS, COMPLETE (UACMP) WITH MICROSCOPIC - Abnormal; Notable for the following:    Color, Urine COLORLESS (*)    APPearance CLEAR (*)    Specific Gravity, Urine 1.002 (*)    Squamous Epithelial / LPF 0-5 (*)    All other components within normal limits  LIPASE, BLOOD  COMPREHENSIVE METABOLIC PANEL  POC URINE PREG, ED  Laboratory work was reviewed and showed no clinically significant abnormalities.    Radiology:  "Ct Abdomen Pelvis W Contrast  Result Date: 05/12/2016 CLINICAL DATA:  Right lower quadrant abdominal pain with nausea and vomiting for 4 days. EXAM: CT ABDOMEN AND PELVIS WITH CONTRAST TECHNIQUE: Multidetector CT imaging of the abdomen and pelvis was performed using the standard protocol following bolus administration of intravenous contrast. CONTRAST:  100mL ISOVUE-300 IOPAMIDOL (ISOVUE-300) INJECTION 61% COMPARISON:  None. FINDINGS: Lower chest: No acute abnormality. Hepatobiliary: Hepatic steatosis is identified. The liver, gallbladder, and portal vein are otherwise normal. Pancreas: Unremarkable. No pancreatic ductal dilatation or surrounding inflammatory changes. Spleen: Normal in size without focal abnormality. Adrenals/Urinary Tract: Adrenal glands are unremarkable. Kidneys are normal, without renal calculi, focal lesion, or hydronephrosis. Bladder is unremarkable. Stomach/Bowel: The stomach is within normal limits. Small bowel loops in the left upper quadrant on series 2, images 28 and 25 demonstrate wall prominence with no adjacent stranding. An adjacent loop demonstrates normal wall thickness but is borderline in caliber measuring 3.3 cm. The remainder of the small bowel is normal. The colon and appendix are normal. Vascular/Lymphatic: No significant vascular findings are present. No enlarged abdominal or pelvic lymph nodes. Reproductive: The patient has an IUD within the uterus. There is a dominant follicle in the left ovary  measuring 3 cm. A follicle seen in the right ovary with a small amount of adjacent fluid. Other: No free air.  Minimal free fluid in the pelvis. Musculoskeletal: No acute or significant osseous findings. IMPRESSION: 1. A few small bowel loops in the left upper quadrant demonstrate apparent wall prominence/thickening with no adjacent stranding. An adjacent loop of bowel is borderline in caliber. It is possible the wall prominence is at least partially due to poor distention. However, an underlying abnormality such as enteritis could present with a similar appearance. Recommend clinical correlation and short-term follow-up to ensure resolution. 2. There is fluid adjacent to the right ovary. This could be due to a ruptured follicle possibly explaining the patient's right lower quadrant pain. 3. The appendix is normal in appearance with no appendicitis. Electronically Signed   By: Gerome Samavid  Williams III M.D   On: 05/12/2016 12:06  "  I personally reviewed the radiologic studies    ED Course:  Differential diagnosis includes but is not exclusive to ovarian  cyst, ovarian torsion, acute appendicitis, urinary tract infection, endometriosis, bowel obstruction, colitis, renal colic, gastroenteritis, etc. Given the patient's current clinical presentation and objective findings patient appears to have had a ovarian cyst. She also has some symptoms consistent with gastroenteritis. Her CAT scan being negative for acute appendicitis along with a normal white blood cell count I felt from a clinical standpoint that it had been ruled out at this point. I did advise her that if her pain gets worse that there is a slim chance that the CAT scan did not identified appendicitis and return here to the emergency department. Patient seems to be of understanding was advised follow-up with her primary physician also. Over-the-counter ibuprofen for pain. Was prescribed Zofran for nausea. She should also return if she develops any bright  red blood in her stool.     Assessment:  Acute onset right lower quadrant abdominal pain Ovarian cyst     Plan: * Outpatient Patient was advised to return immediately if condition worsens. Patient was advised to follow up with their primary care physician or other specialized physicians involved in their outpatient care. The patient and/or family member/power of attorney had laboratory results reviewed at the bedside. All questions and concerns were addressed and appropriate discharge instructions were distributed by the nursing staff.             Jennye Moccasin, MD 05/12/16 1344

## 2018-08-05 IMAGING — CT CT ABD-PELV W/ CM
2 of 4 series · 15 of 46 positions shown, 17 images · IV contrast (iopamidol)
Comparison: None.

CLINICAL DATA: Right lower quadrant abdominal pain with nausea and
vomiting for 4 days.

EXAM:
CT ABDOMEN AND PELVIS WITH CONTRAST
TECHNIQUE: Multidetector CT imaging of the abdomen and pelvis was performed
using the standard protocol following bolus administration of
intravenous contrast.
CONTRAST:  100mL [id number]-6UU IOPAMIDOL (WMYB9W-6UU) INJECTION 61%

[Series 2: routine abd/pel with · axial · 0.64mm/px · z∈[-782,-377]mm · 12 of 93 slices shown, 14 images]
[im 8/93  soft-tissue]
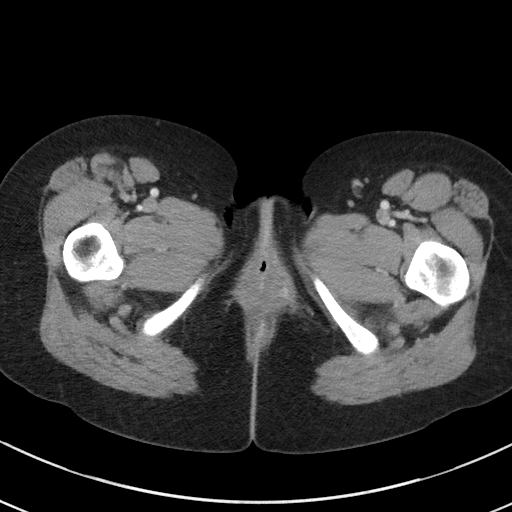
[im 8/93  bone]
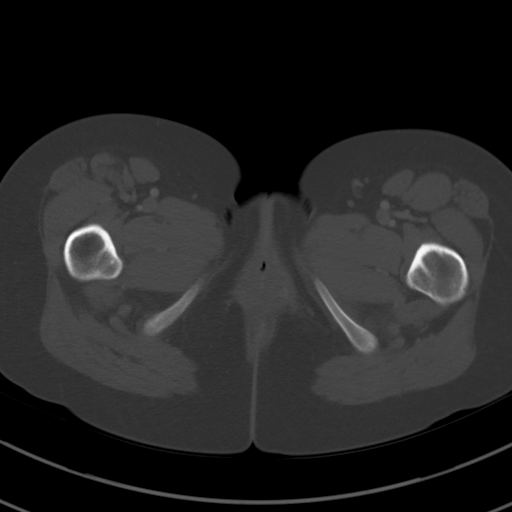
[im 15/93  soft-tissue]
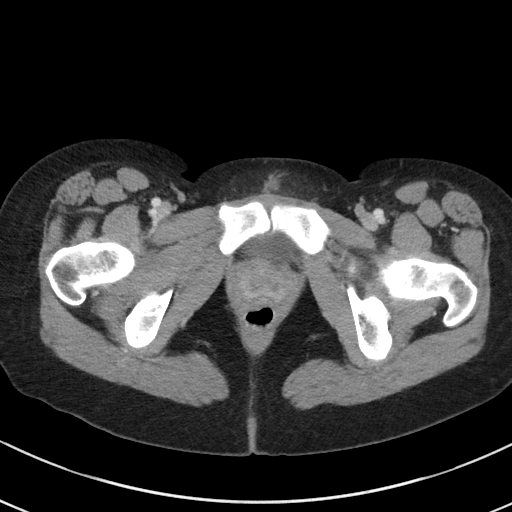
[im 23/93  soft-tissue]
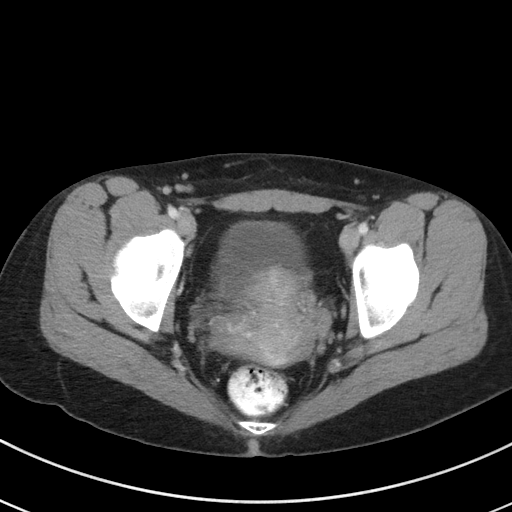
[im 30/93  soft-tissue]
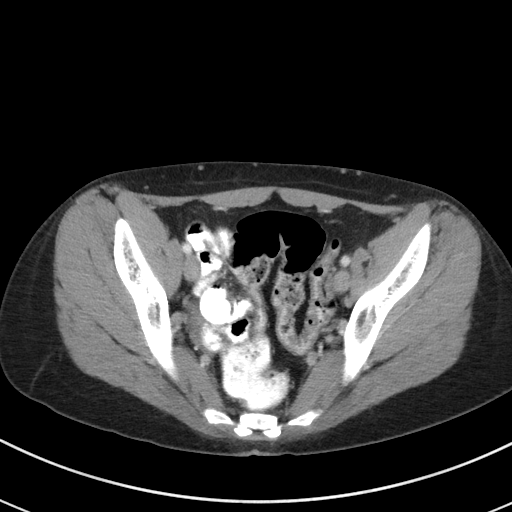
[im 37/93  soft-tissue]
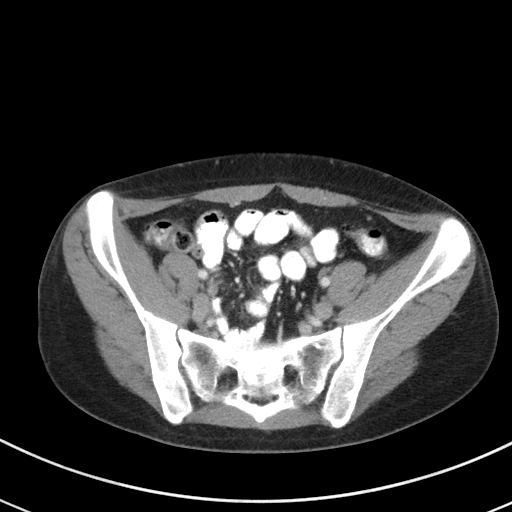
[im 45/93  soft-tissue]
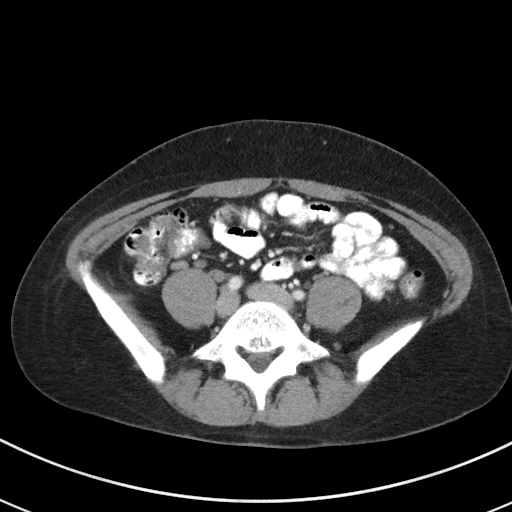
[im 52/93  soft-tissue]
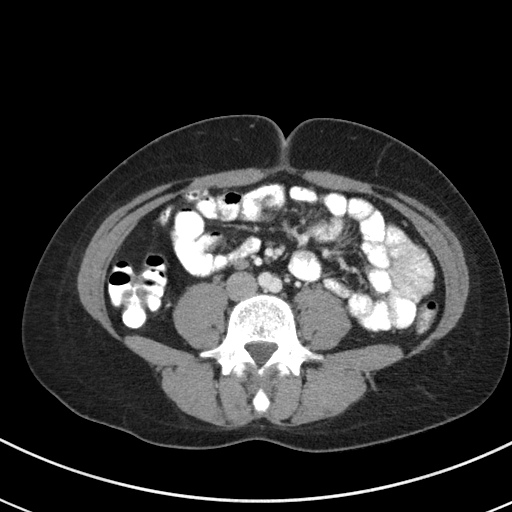
[im 59/93  soft-tissue]
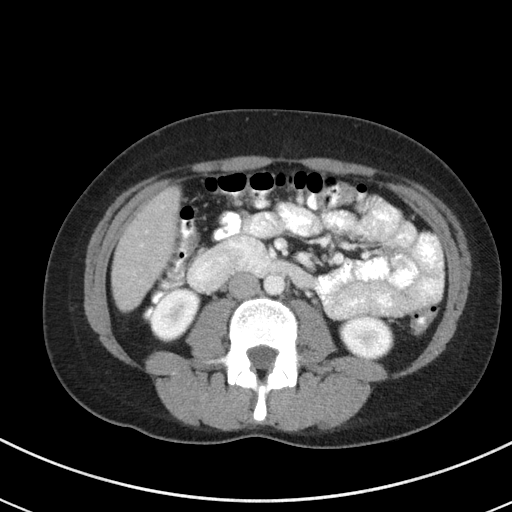
[im 67/93  soft-tissue]
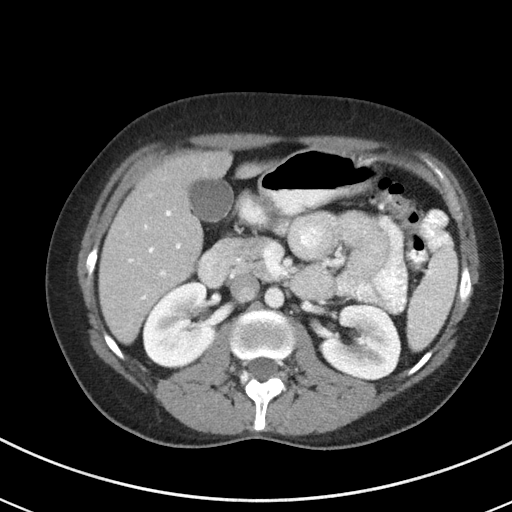
[im 67/93  bone]
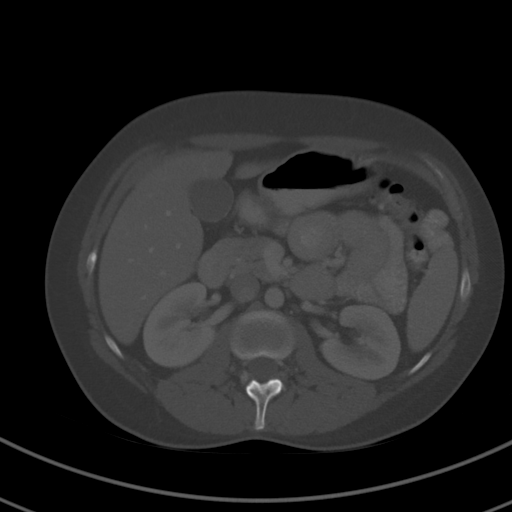
[im 74/93  soft-tissue]
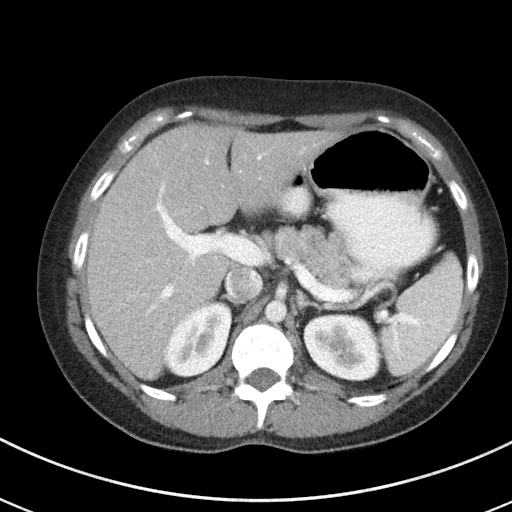
[im 81/93  soft-tissue]
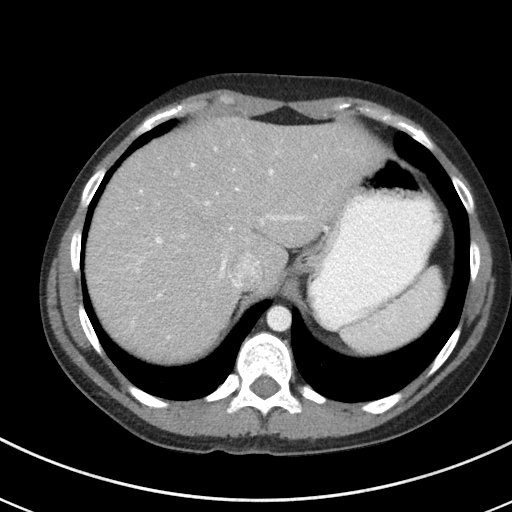
[im 89/93  soft-tissue]
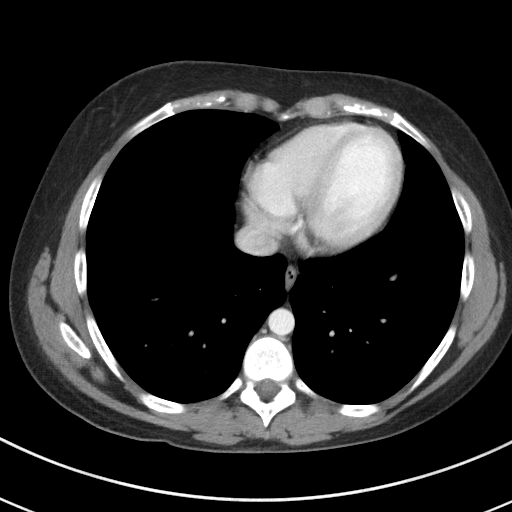

[Series 5: coronal st · coronal · 0.68mm/px · 3 of 84 slices shown]
[im 28/84  soft-tissue]
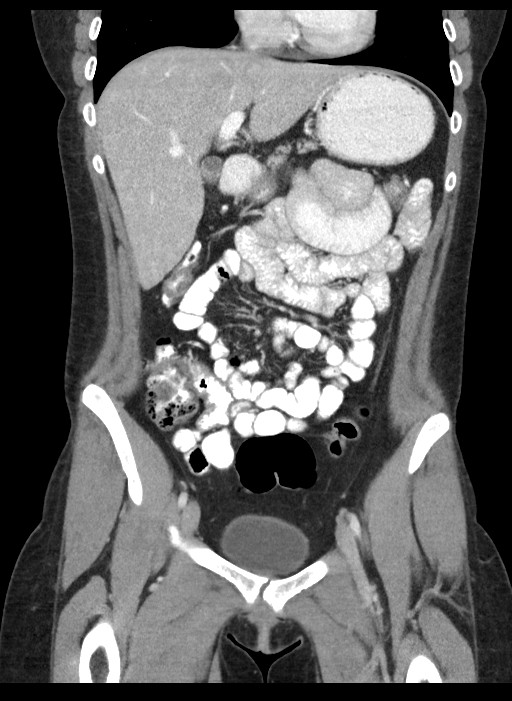
[im 37/84  soft-tissue]
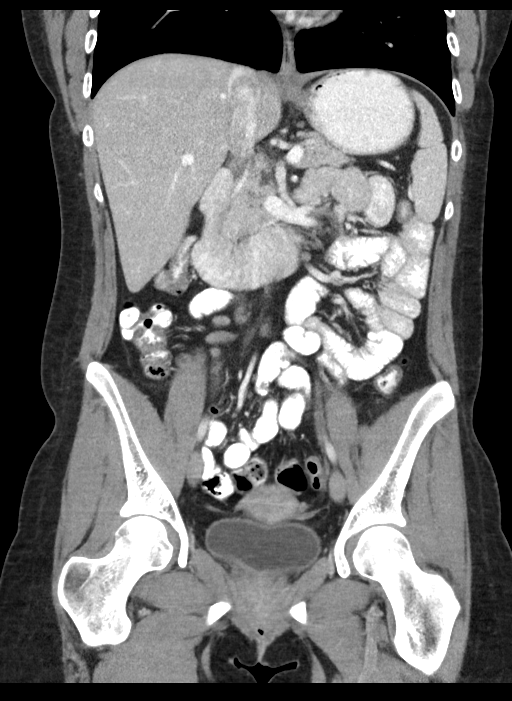
[im 47/84  soft-tissue]
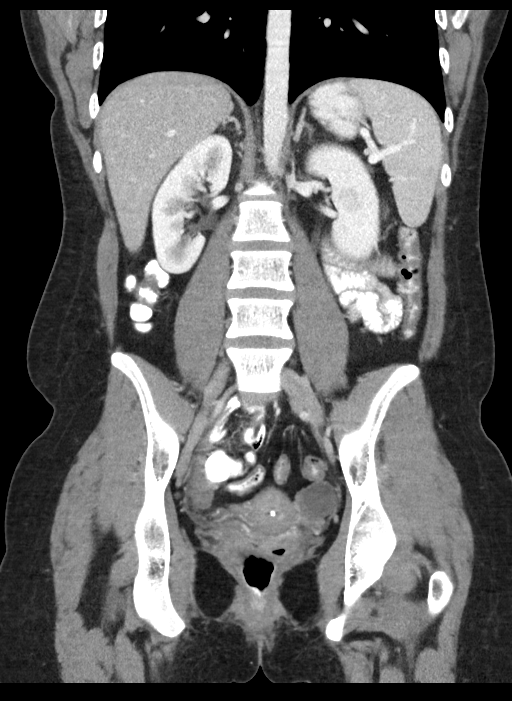

[15 of 46 positions shown; findings below may reference images not displayed]

FINDINGS: Lower chest: No acute abnormality.

Hepatobiliary: Hepatic steatosis is identified. The liver,
gallbladder, and portal vein are otherwise normal.

Pancreas: Unremarkable. No pancreatic ductal dilatation or
surrounding inflammatory changes.

Spleen: Normal in size without focal abnormality.

Adrenals/Urinary Tract: Adrenal glands are unremarkable. Kidneys are
normal, without renal calculi, focal lesion, or hydronephrosis.
Bladder is unremarkable.

Stomach/Bowel: The stomach is within normal limits. Small bowel
loops in the left upper quadrant on series 2, images 28 and 25
demonstrate wall prominence with no adjacent stranding. An adjacent
loop demonstrates normal wall thickness but is borderline in caliber
measuring 3.3 cm. The remainder of the small bowel is normal. The
colon and appendix are normal.

Vascular/Lymphatic: No significant vascular findings are present. No
enlarged abdominal or pelvic lymph nodes.

Reproductive: The patient has an IUD within the uterus. There is a
dominant follicle in the left ovary measuring 3 cm. A follicle seen
in the right ovary with a small amount of adjacent fluid.

Other: No free air.  Minimal free fluid in the pelvis.

Musculoskeletal: No acute or significant osseous findings.
IMPRESSION: 1. A few small bowel loops in the left upper quadrant demonstrate
apparent wall prominence/thickening with no adjacent stranding. An
adjacent loop of bowel is borderline in caliber. It is possible the
wall prominence is at least partially due to poor distention.
However, an underlying abnormality such as enteritis could present
with a similar appearance. Recommend clinical correlation and
short-term follow-up to ensure resolution.
2. There is fluid adjacent to the right ovary. This could be due to
a ruptured follicle possibly explaining the patient's right lower
quadrant pain.
3. The appendix is normal in appearance with no appendicitis.
